# Patient Record
Sex: Male | Born: 1955 | Race: White | Hispanic: No | Marital: Married | Smoking: Former smoker
Health system: Southern US, Community
[De-identification: ages and names within clinical notes are randomized; demographics above are authoritative.]

## PROBLEM LIST (undated history)

## (undated) DIAGNOSIS — E785 Hyperlipidemia, unspecified: Secondary | ICD-10-CM

## (undated) DIAGNOSIS — J449 Chronic obstructive pulmonary disease, unspecified: Secondary | ICD-10-CM

## (undated) DIAGNOSIS — C801 Malignant (primary) neoplasm, unspecified: Secondary | ICD-10-CM

## (undated) DIAGNOSIS — M1990 Unspecified osteoarthritis, unspecified site: Secondary | ICD-10-CM

## (undated) DIAGNOSIS — C61 Malignant neoplasm of prostate: Secondary | ICD-10-CM

## (undated) DIAGNOSIS — Z8042 Family history of malignant neoplasm of prostate: Secondary | ICD-10-CM

## (undated) DIAGNOSIS — M549 Dorsalgia, unspecified: Secondary | ICD-10-CM

## (undated) HISTORY — DX: Family history of malignant neoplasm of prostate: Z80.42

## (undated) HISTORY — DX: Hyperlipidemia, unspecified: E78.5

## (undated) HISTORY — PX: PROSTATE BIOPSY: SHX241

## (undated) HISTORY — DX: Chronic obstructive pulmonary disease, unspecified: J44.9

## (undated) HISTORY — PX: COLONOSCOPY: SHX174

---

## 2008-03-16 HISTORY — PX: BACK SURGERY: SHX140

## 2009-12-23 ENCOUNTER — Encounter: Payer: Self-pay | Admitting: Internal Medicine

## 2009-12-29 ENCOUNTER — Encounter: Payer: Self-pay | Admitting: Internal Medicine

## 2010-01-16 ENCOUNTER — Ambulatory Visit: Payer: Self-pay | Admitting: Internal Medicine

## 2010-01-16 DIAGNOSIS — J449 Chronic obstructive pulmonary disease, unspecified: Secondary | ICD-10-CM | POA: Insufficient documentation

## 2010-01-16 DIAGNOSIS — R635 Abnormal weight gain: Secondary | ICD-10-CM | POA: Insufficient documentation

## 2010-01-16 DIAGNOSIS — E785 Hyperlipidemia, unspecified: Secondary | ICD-10-CM | POA: Insufficient documentation

## 2010-02-24 ENCOUNTER — Encounter: Admission: RE | Admit: 2010-02-24 | Discharge: 2010-02-24 | Payer: Self-pay | Admitting: Diagnostic Neuroimaging

## 2010-02-26 ENCOUNTER — Ambulatory Visit: Payer: Self-pay | Admitting: Internal Medicine

## 2010-05-18 ENCOUNTER — Telehealth (INDEPENDENT_AMBULATORY_CARE_PROVIDER_SITE_OTHER): Payer: Self-pay | Admitting: *Deleted

## 2010-05-28 ENCOUNTER — Telehealth (INDEPENDENT_AMBULATORY_CARE_PROVIDER_SITE_OTHER): Payer: Self-pay | Admitting: *Deleted

## 2010-06-27 ENCOUNTER — Ambulatory Visit: Payer: Self-pay | Admitting: Internal Medicine

## 2010-10-16 NOTE — Assessment & Plan Note (Signed)
Summary: Pulmonary/ f/u ov with hfa teaching   Copy to:  Dr. Valinda Party Primary Provider/Referring Provider:  none  CC:  Followup with PFT's.  Pt states that cough has resolved.  He states that he spits up clear to yellow sputum in the am.  No new complaints today.Marland Kitchen  History of Present Illness: 77 yowm on disability due back injury 2009 @ wt 210  quit smoking 2009 moved to [place] 2011 with lifelong flares of nasal congestion assoc with coughing x all his life but never saw allergist or pulmonary doctor or maintained on any inhaled agents  Jan 16, 2010 cc minimal wheeze and am mucus despite proaire 3 x daily after abx x2 and prednisone. rec review optimal use of saba and gerd diet, reconditioning and return for pfts  February 26, 2010 Followup with PFT's.  Pt states that cough has resolved.  He states that he spits up clear to yellow sputum in the am.  No new complaints today. not limited by sob. Pt denies any significant sore throat, dysphagia, itching, sneezing,  nasal congestion or excess secretions,  fever, chills, sweats, unintended wt loss, pleuritic or exertional cp, hempoptysis, change in activity tolerance  orthopnea pnd or leg swelling.    Current Medications (verified): 1)  Nexium 40 Mg Cpdr (Esomeprazole Magnesium) .... Take One 30-60 Min Before First and Last Meals of The Day 2)  Flexeril 10 Mg Tabs (Cyclobenzaprine Hcl) .Marland Kitchen.. 1 Every 8 Hours As Needed 3)  Cialis 20 Mg Tabs (Tadalafil) .... As Directed As Needed 4)  Oxycodone Hcl 10 Mg Tabs (Oxycodone Hcl) .... 1/2 As Directed As Needed 5)  Neurontin 600 Mg Tabs (Gabapentin) .Marland Kitchen.. 1 Two Times A Day As Needed 6)  Proair Hfa 108 (90 Base) Mcg/act Aers (Albuterol Sulfate) .... 2 Puffs Every 4-6 Hours As Needed  Allergies (verified): 1)  ! Pcn 2)  ! Levaquin 3)  ! * Sporonox  Past History:  Past Medical History: Hyperlipidemia COPD     - PFT's February 26, 2010   FEV1 1.97  (51%)  ratio 49 with 41% improvement after B2, nl 80     -  HFA 75% February 26, 2010   Vital Signs:  Patient profile:   55 year old male Weight:      243 pounds O2 Sat:      98 % on Room air Temp:     98.2 degrees F oral Pulse rate:   64 / minute BP sitting:   130 / 78  (left arm)  Vitals Entered By: Vernie Murders (February 26, 2010 12:07 PM)  O2 Flow:  Room air  Physical Exam  Additional Exam:  amb wm nad wt 250 Jan 16, 2010 > 243 February 26, 2010  HEENT: nl dentition, turbinates, and orophanx. Nl external ear canals without cough reflex NECK :  without JVD/Nodes/TM/ nl carotid upstrokes bilaterally LUNGS: no acc muscle use, clear to A and P bilaterally without cough on insp or exp maneuvers CV:  RRR  no s3 or murmur or increase in P2, no edema  ABD:  soft and nontender with nl excursion in the supine position. No bruits or organomegaly, bowel sounds nl MS:  warm without deformities, calf tenderness, cyanosis or clubbing      Impression & Recommendations:  Problem # 1:  COPD UNSPECIFIED (ICD-496) More severe (GOLD II) than previously appreciated probably because more limited by back and legs than breathing; however, has very significant reversible (AB) component and would therefore  benefit from Symbicort trial  I spent extra time with the patient today explaining optimal mdi  technique.  This improved from  50-75%   Each maintenance medication was reviewed in detail including most importantly the difference between maintenance and as needed and under what circumstances the prns are to be used. See instructions for specific recommendations   I discussed the importance of understanding the goals of asthma therapy including the rule of 2's as it applies to the use of a rescue inhaler.   Medications Added to Medication List This Visit: 1)  Symbicort 160-4.5 Mcg/act Aero (Budesonide-formoterol fumarate) .... 2 puffs first thing  in am and 2 puffs again in pm about 12 hours later  Other Orders: Est. Patient Level IV (16109)  Patient  Instructions: 1)  Symbiocort 160 2 puffs first thing  in am and 2 puffs again in pm about 12 hours later  2)  Work on inhaler technique:  relax and blow all the way out then take a nice smooth deep breath back in, triggering the inhaler at same time you start breathing in 3)  Return to office in 3 months, sooner if needed  Prescriptions: SYMBICORT 160-4.5 MCG/ACT  AERO (BUDESONIDE-FORMOTEROL FUMARATE) 2 puffs first thing  in am and 2 puffs again in pm about 12 hours later  #1 x 11   Entered and Authorized by:   Nyoka Cowden MD   Signed by:   Nyoka Cowden MD on 02/26/2010   Method used:   Print then Give to Patient   RxID:   [id number]

## 2010-10-16 NOTE — Assessment & Plan Note (Signed)
Summary: Pulmonary/ ext summary f/u ov with hfa 90%   Copy to:  Dr. Valinda Party Primary Provider/Referring Provider:  none  CC:  COPD followup.  Breathing is the same.  He c/o throat clearing in the am..  History of Present Illness: 46  yowm on disability due back injury 2009 @ wt 210  quit smoking 2009 moved to [place] 2011 with lifelong flares of nasal congestion assoc with coughing x all his life but never saw allergist or pulmonary doctor or maintained on any inhaled agents  Jan 16, 2010 cc minimal wheeze and am mucus despite proaire 3 x daily after abx x2 and prednisone. rec review optimal use of saba and gerd diet, reconditioning and return for pfts  Jun 27 2010 ov cc doe.    Pt states that cough has resolved.  He states that he spits up clear to yellow sputum in the am only then fine the rest of the day. .  No new complaints today. not limited by sob but by back at this point.   Pt denies any significant sore throat, dysphagia, itching, sneezing,  nasal congestion or excess secretions,  fever, chills, sweats, unintended wt loss, pleuritic or exertional cp, hempoptysis, change in activity tolerance  orthopnea pnd or leg swelling. Pt also denies any obvious fluctuation in symptoms with weather or environmental change or other alleviating or aggravating factors.     Pt denies any increase in rescue therapy over baseline, denies waking up needing it or having early am exacerbations of coughing/wheezing/ or dyspnea     Current Medications (verified): 1)  Nexium 40 Mg Cpdr (Esomeprazole Magnesium) .... Take One 30-60 Min Before First and Last Meals of The Day 2)  Flexeril 10 Mg Tabs (Cyclobenzaprine Hcl) .Marland Kitchen.. 1 Every 8 Hours As Needed 3)  Cialis 20 Mg Tabs (Tadalafil) .... As Directed As Needed 4)  Symbicort 160-4.5 Mcg/act  Aero (Budesonide-Formoterol Fumarate) .... 2 Puffs First Thing  in Am and 2 Puffs Again in Pm About 12 Hours Later- 2 Puffs Once Per Day 5)  Proair Hfa 108 (90 Base)  Mcg/act Aers (Albuterol Sulfate) .... 2 Puffs Every 4-6 Hours As Needed 6)  Lyrica ( ? Strength) .Marland Kitchen.. 1 Two Times A Day 7)  Flexeril 10 Mg Tabs (Cyclobenzaprine Hcl) .Marland Kitchen.. 1 Two Times A Day 8)  Tramadol Hcl 50 Mg Tabs (Tramadol Hcl) .Marland Kitchen.. 1 Two Times A Day 9)  Cod Liver Oil  Caps (Cod Liver Oil) .Marland Kitchen.. 1 Once Daily 10)  Multivitamins  Tabs (Multiple Vitamin) .Marland Kitchen.. 1 Once Daily 11)  Prostate  Tabs (Specialty Vitamins Products) .Marland Kitchen.. 1 Once Daily 12)  Glucosamine-Chondroitin  Caps (Glucosamine-Chondroit-Vit C-Mn) .Marland Kitchen.. 1 Once Daily  Allergies (verified): 1)  ! Pcn 2)  ! Levaquin 3)  ! * Sporonox  Past History:  Past Medical History: Hyperlipidemia COPD     - PFT's February 26, 2010   FEV1 1.97  (51%)  ratio 49 with 41% improvement after B2, nl 80     - HFA 75% February 26, 2010  >  90% June 27, 2010   Vital Signs:  Patient profile:   55 year old male Weight:      244 pounds O2 Sat:      95 % on Room air Temp:     98.1 degrees F oral Pulse rate:   80 / minute BP sitting:   120 / 80  (left arm)  Vitals Entered By: Vernie Murders (June 27, 2010 11:23 AM)  O2  Flow:  Room air  Physical Exam  Additional Exam:  amb wm nad wt 250 Jan 16, 2010 > 243 February 26, 2010 > 244 June 27, 2010  HEENT: nl dentition, turbinates, and orophanx. Nl external ear canals without cough reflex NECK :  without JVD/Nodes/TM/ nl carotid upstrokes bilaterally LUNGS: no acc muscle use, clear to A and P bilaterally without cough on insp or exp maneuvers CV:  RRR  no s3 or murmur or increase in P2, no edema  ABD:  soft and nontender with nl excursion in the supine position. No bruits or organomegaly, bowel sounds nl MS:  warm without deformities, calf tenderness, cyanosis or clubbing      Impression & Recommendations:  Problem # 1:  COPD UNSPECIFIED (ICD-496) GOLD II with definite  benefit from Symbicort trial  I spent extra time with the patient today explaining optimal mdi  technique.  This improved from  75 -90% p coaching   Each maintenance medication was reviewed in detail including most importantly the difference between maintenance and as needed and under what circumstances the prns are to be used. See instructions for specific recommendations   I discussed the importance of understanding the goals of asthma therapy including the rule of 2's as it applies to the use of a rescue inhaler.     DDX of  difficult airways managment all start with A and  include Adherence, Ace Inhibitors, Acid Reflux, Active Sinus Disease, Alpha 1 Antitripsin deficiency, Anxiety masquerading as Airways dz,  ABPA,  allergy(esp in young), Aspiration (esp in elderly), Adverse effects of DPI,  Active smokers, plus one B  = Beta blocker use..   Acid reflux still a concern.  NB the  ramp to expected improvement (and for that matter, worsening, if a chronic effective medication is stopped)  can be measured in weeks, not days, a common misconception because this is not Heartburn with no immediate cause and effect relationship so that response to therapy or lack thereof can be very difficult to assess.  rec continue nexium,  Stop all oil based supplements.  Medications Added to Medication List This Visit: 1)  Symbicort 160-4.5 Mcg/act Aero (Budesonide-formoterol fumarate) .... 2 puffs first thing  in am and 2 puffs again in pm about 12 hours optional if any breathing or coughing 2)  Symbicort 160-4.5 Mcg/act Aero (Budesonide-formoterol fumarate) .... 2 puffs first thing  in am and 2 puffs again in pm about 12 hours later- 2 puffs once per day 3)  Lyrica ( ? Strength)  .Marland Kitchen.. 1 two times a day 4)  Flexeril 10 Mg Tabs (Cyclobenzaprine hcl) .Marland Kitchen.. 1 two times a day 5)  Tramadol Hcl 50 Mg Tabs (Tramadol hcl) .Marland Kitchen.. 1 two times a day 6)  Cod Liver Oil Caps (Cod liver oil) .Marland Kitchen.. 1 once daily 7)  Multivitamins Tabs (Multiple vitamin) .Marland Kitchen.. 1 once daily 8)  Prostate Tabs (Specialty vitamins products) .Marland Kitchen.. 1 once daily 9)   Glucosamine-chondroitin Caps (Glucosamine-chondroit-vit c-mn) .Marland Kitchen.. 1 once daily  Other Orders: Est. Patient Level IV (14782) HFA Instruction [phone])  Patient Instructions: 1)  ok to take symbicort 160 2 puffs first thing  in am and 2 puffs again in pm about 12 hours if needed 2)  Work on inhaler technique:  relax and blow all the way out then take a nice smooth deep breath back in, triggering the inhaler at same time you start breathing in and hold a few seconds then out thru the nose and brush teetch  tongue and gargle  3)  GERD (REFLUX)  is a common cause of respiratory symptoms. It commonly presents without heartburn and can be treated with medication, but also with lifestyle changes including avoidance of late meals, excessive alcohol, smoking cessation, and avoid fatty foods, chocolate, peppermint, colas, red wine, and acidic juices such as orange juice. NO MINT OR MENTHOL PRODUCTS SO NO COUGH DROPS  4)  USE SUGARLESS CANDY INSTEAD (jolley ranchers)  5)  NO OIL BASED VITAMINS  6)  Please schedule a follow-up appointment as needed.

## 2010-10-16 NOTE — Progress Notes (Signed)
Summary: diagnosis  Phone Note Call from Patient Call back at Home Phone [phone]   Caller: Patient Call For: wert Summary of Call: pt wants to speak to nurse. just wants her to expalin his diagnosis to him.  Initial call taken by: Tivis Ringer, CNA,  May 18, 2010 9:55 AM  Follow-up for Phone Call        Spoke with pt and explained that per last ov note his dx is COPD.  He is requesting some literature on this so I mailed him a handout and sched him rov for 05/29/10.   Follow-up by: Vernie Murders,  May 18, 2010 10:19 AM

## 2010-10-16 NOTE — Assessment & Plan Note (Signed)
Summary: Pulmonary/ copd eval with hfa teaching   Visit Type:  Initial Consult Copy to:  Dr. Valinda Party Primary Provider/Referring Provider:  none  CC:  Cough.  History of Present Illness: 23 yowm on disability due back injury 2009 @ wt 210  quit smoking 2009 moved to [place] 2011 with lifelong flares of nasal congestion assoc with coughing x all his life but never saw allergist or pulmonary doctor or maintained on any inhaled  Jan 16, 2010 cc minimal wheeze and am mucus despite proaire 3 x daily after abx x2 and prednisone. Pt denies any significant sore throat, dysphagia, itching, sneezing,  nasal congestion or excess secretions,  fever, chills, sweats, unintended wt loss, pleuritic or exertional cp, hempoptysis, change in activity tolerance  orthopnea pnd or leg swelling Pt also denies any obvious fluctuation in symptoms with weather or environmental change or other alleviating or aggravating factors.         Current Medications (verified): 1)  Nexium 40 Mg Cpdr (Esomeprazole Magnesium) .Marland Kitchen.. 1 Two Times A Day 2)  Flexeril 10 Mg Tabs (Cyclobenzaprine Hcl) .Marland Kitchen.. 1 Every 8 Hours As Needed 3)  Cialis 20 Mg Tabs (Tadalafil) .... As Directed As Needed 4)  Oxycodone Hcl 10 Mg Tabs (Oxycodone Hcl) .... 1/2 As Directed As Needed 5)  Neurontin 600 Mg Tabs (Gabapentin) .Marland Kitchen.. 1 Two Times A Day As Needed 6)  Proair Hfa 108 (90 Base) Mcg/act Aers (Albuterol Sulfate) .... 2 Puffs Every 4-6 Hours As Needed  Allergies (verified): 1)  ! Pcn 2)  ! Levaquin 3)  ! * Sporonox  Past History:  Past Medical History: Hyperlipidemia COPD  Past Surgical History: L-4, L-5 surgery July 2009  Family History: Mother had unknown type of CA Heart dz- Father Allergies- Mother  Social History: Divorced Lives with girlfriend Umemployed Former smoker. Quit in 2009.  Smoked 1 ppd x 30 yrs. Moved to [place] from [place] in 2011 ETOH twice per wk  Review of Systems       The patient complains of shortness of  breath with activity, productive cough, acid heartburn, weight change, headaches, nasal congestion/difficulty breathing through nose, and anxiety.  The patient denies shortness of breath at rest, non-productive cough, coughing up blood, chest pain, irregular heartbeats, indigestion, loss of appetite, abdominal pain, difficulty swallowing, sore throat, tooth/dental problems, sneezing, itching, ear ache, depression, hand/feet swelling, joint stiffness or pain, rash, change in color of mucus, and fever.    Vital Signs:  Patient profile:   55 year old male Height:      74 inches Weight:      250.13 pounds BMI:     32.23 O2 Sat:      96 % on Room air Temp:     97.9 degrees F oral Pulse rate:   79 / minute BP sitting:   140 / 80  (left arm)  Vitals Entered By: Vernie Murders (Jan 16, 2010 11:06 AM)  O2 Flow:  Room air  Physical Exam  Additional Exam:  amb wm nad wt 250 Jan 16, 2010 HEENT: nl dentition, turbinates, and orophanx. Nl external ear canals without cough reflex NECK :  without JVD/Nodes/TM/ nl carotid upstrokes bilaterally LUNGS: no acc muscle use, clear to A and P bilaterally without cough on insp or exp maneuvers CV:  RRR  no s3 or murmur or increase in P2, no edema  ABD:  soft and nontender with nl excursion in the supine position. No bruits or organomegaly, bowel sounds nl  MS:  warm without deformities, calf tenderness, cyanosis or clubbing SKIN: warm and dry without lesions   NEURO:  alert, approp, no deficits     Impression & Recommendations:  Problem # 1:  COPD UNSPECIFIED (ICD-496) When respiratory symptoms begin well after a patient reports complete smoking cessation,  it is very hard to "blame" COPD  ie it doesn't make any more sense than hearing a  NASCAR driver wrecked his car while driving his kids to school or a Careers adviser sliced his hand off carving [place].  Once the high risk activity stops,  the symptoms should not suddenly erupt.  If so, the differential diagnosis  should include  obesity/deconditioning,  LPR/Reflux, CHF, or side effect of medications   Suspect obesity and reflux driving most of his symptoms because he doesn't have prominent features of copd at this point  I spent extra time with the patient today explaining optimal mdi  technique.  This improved from  50-75%  I discussed the importance of understanding the goals of asthma therapy including the rule of 2's as it applies to the use of a rescue inhaler.   Problem # 2:  WEIGHT GAIN, ABNORMAL (ICD-783.1)  Weight control is a matter of calorie balance which needs to be tilted in the pt's favor by eating less and exercising more.  Specifically, I recommended  exercise at a level where pt  is short of breath but not out of breath 30 minutes daily.  If not losing weight on this program, I would strongly recommend pt see a nutritionist with a food diary recorded for two weeks prior to the visit.     Orders: Consultation Level IV (81191)  Medications Added to Medication List This Visit: 1)  Nexium 40 Mg Cpdr (Esomeprazole magnesium) .Marland Kitchen.. 1 two times a day 2)  Nexium 40 Mg Cpdr (Esomeprazole magnesium) .... Take one 30-60 min before first and last meals of the day 3)  Flexeril 10 Mg Tabs (Cyclobenzaprine hcl) .Marland Kitchen.. 1 every 8 hours as needed 4)  Cialis 20 Mg Tabs (Tadalafil) .... As directed as needed 5)  Oxycodone Hcl 10 Mg Tabs (Oxycodone hcl) .... 1/2 as directed as needed 6)  Neurontin 600 Mg Tabs (Gabapentin) .Marland Kitchen.. 1 two times a day as needed 7)  Proair Hfa 108 (90 Base) Mcg/act Aers (Albuterol sulfate) .... 2 puffs every 4-6 hours as needed  Patient Instructions: 1)  Please schedule a follow-up appointment in 6 weeks, sooner if needed with PFT's on return 2)  Weight control is simply a matter of calorie balance which needs to be tilted in your favor by eating less and exercising more.  To get the most out of exercise, you need to be continuously aware that you are short of breath, but never  out of breath, for 30 minutes daily. As you improve, it will actually be easier for you to do the same amount in  30 minutes so always push to the level where you are short of breath.  If this does not result in gradual weight reduction,  I recommend  a nutritionist for a food diary 3)  GERD (REFLUX)  is a common cause of respiratory symptoms. It commonly presents without heartburn and can be treated with medication, but also with lifestyle changes including avoidance of late meals, excessive alcohol, smoking cessation, and avoid fatty foods, chocolate, peppermint, colas, red wine, and acidic juices such as orange juice. NO MINT OR MENTHOL PRODUCTS SO NO COUGH DROPS  4)  USE SUGARLESS  CANDY INSTEAD (jolley ranchers)  5)  NO OIL BASED VITAMINS  6)  Use proaire only if needed up to every 4 hours with the goal of not needing it at all - if you start to need more proaire call me  Prescriptions: PROAIR HFA 108 (90 BASE) MCG/ACT AERS (ALBUTEROL SULFATE) 2 puffs every 4-6 hours as needed  #1 x 1   Entered and Authorized by:   Nyoka Cowden MD   Signed by:   Nyoka Cowden MD on 01/16/2010   Method used:   Electronically to        [facility] [address] [phone]* (retail)       [address]       [place], [place]  [address]       Ph: [phone] or [phone]       Fax: [phone]   RxID:   [id number]

## 2010-10-16 NOTE — Progress Notes (Signed)
Summary: sample  Phone Note Call from Patient Call back at Home Phone [phone]   Caller: Patient Call For: wert Reason for Call: Talk to Nurse Summary of Call: pt would like to know if he can get a Symbicort sample - Can pick up today or tomorrow. Initial call taken by: Eugene Gavia,  May 28, 2010 10:40 AM  Follow-up for Phone Call        Spoke with pt and notified sample left up front for pick up.  Rx was sent to pharm. Follow-up by: Vernie Murders,  May 28, 2010 11:08 AM    Prescriptions: SYMBICORT 160-4.5 MCG/ACT  AERO (BUDESONIDE-FORMOTEROL FUMARATE) 2 puffs first thing  in am and 2 puffs again in pm about 12 hours later  #1 x 11   Entered by:   Vernie Murders   Authorized by:   Nyoka Cowden MD   Signed by:   Vernie Murders on 05/28/2010   Method used:   Electronically to        [facility] [address] [phone]* (retail)       [address]       [place], [place]  [address]       Ph: [phone] or [phone]       Fax: [phone]   RxID:   [id number]

## 2010-10-16 NOTE — Miscellaneous (Signed)
Summary: Orders Update pft charges  Clinical Lists Changes  Orders: Added new Service order of Carbon Monoxide diffusing w/capacity (94720) - Signed Added new Service order of Lung Volumes (94240) - Signed Added new Service order of Spirometry (Pre & Post) (94060) - Signed 

## 2011-05-27 ENCOUNTER — Emergency Department (HOSPITAL_COMMUNITY)
Admission: EM | Admit: 2011-05-27 | Discharge: 2011-05-28 | Disposition: A | Discharge status: Home / Self Care | Payer: Medicare Other | Attending: Emergency Medicine | Admitting: Emergency Medicine

## 2011-05-27 DIAGNOSIS — I4510 Unspecified right bundle-branch block: Secondary | ICD-10-CM | POA: Insufficient documentation

## 2011-05-27 DIAGNOSIS — M79609 Pain in unspecified limb: Secondary | ICD-10-CM | POA: Insufficient documentation

## 2011-05-28 LAB — COMPREHENSIVE METABOLIC PANEL
ALT: 29 U/L (ref 0–53)
AST: 25 U/L (ref 0–37)
Albumin: 4.3 g/dL (ref 3.5–5.2)
Alkaline Phosphatase: 63 U/L (ref 39–117)
BUN: 17 mg/dL (ref 6–23)
CO2: 28 mEq/L (ref 19–32)
Calcium: 9.8 mg/dL (ref 8.4–10.5)
Chloride: 102 mEq/L (ref 96–112)
Creatinine, Ser: 0.96 mg/dL (ref 0.50–1.35)
GFR calc Af Amer: >60 mL/min (ref >60)
GFR calc non Af Amer: >60 mL/min (ref >60)
Glucose, Bld: 90 mg/dL (ref 70–99)
Potassium: 4.1 mEq/L (ref 3.5–5.1)
Sodium: 137 mEq/L (ref 135–145)
Total Bilirubin: 0.2 mg/dL — ABNORMAL LOW (ref 0.3–1.2)
Total Protein: 7.5 g/dL (ref 6.0–8.3)

## 2011-05-28 LAB — DIFFERENTIAL
Basophils Absolute: 0 10*3/uL (ref 0.0–0.1)
Basophils Relative: 1 % (ref 0–1)
Eosinophils Absolute: 0.2 10*3/uL (ref 0.0–0.7)
Eosinophils Relative: 3 % (ref 0–5)
Lymphocytes Relative: 47 % — ABNORMAL HIGH (ref 12–46)
Lymphs Abs: 3.6 10*3/uL (ref 0.7–4.0)
Monocytes Absolute: 0.7 10*3/uL (ref 0.1–1.0)
Monocytes Relative: 10 % (ref 3–12)
Neutro Abs: 3 10*3/uL (ref 1.7–7.7)
Neutrophils Relative %: 39 % — ABNORMAL LOW (ref 43–77)

## 2011-05-28 LAB — POCT I-STAT TROPONIN I: Troponin i, poc: 0 ng/mL (ref 0.00–0.08)

## 2011-05-28 LAB — CBC
HCT: 42.7 % (ref 39.0–52.0)
Hemoglobin: 14.8 g/dL (ref 13.0–17.0)
MCH: 30.3 pg (ref 26.0–34.0)
MCHC: 34.7 g/dL (ref 30.0–36.0)
MCV: 87.3 fL (ref 78.0–100.0)
Platelets: 358 10*3/uL (ref 150–400)
RBC: 4.89 MIL/uL (ref 4.22–5.81)
RDW: 12.2 % (ref 11.5–15.5)
WBC: 7.5 10*3/uL (ref 4.0–10.5)

## 2011-05-28 LAB — CK TOTAL AND CKMB (NOT AT ARMC)
CK, MB: 3.6 ng/mL (ref 0.3–4.0)
Relative Index: 3.2 — ABNORMAL HIGH (ref 0.0–2.5)
Total CK: 114 U/L (ref 7–232)

## 2011-07-16 ENCOUNTER — Other Ambulatory Visit: Payer: Self-pay | Admitting: Internal Medicine

## 2011-08-13 ENCOUNTER — Other Ambulatory Visit: Payer: Self-pay | Admitting: Internal Medicine

## 2011-10-07 ENCOUNTER — Encounter: Payer: Self-pay | Admitting: Internal Medicine

## 2011-10-08 ENCOUNTER — Ambulatory Visit (INDEPENDENT_AMBULATORY_CARE_PROVIDER_SITE_OTHER)
Admission: RE | Admit: 2011-10-08 | Discharge: 2011-10-08 | Disposition: A | Discharge status: Home / Self Care | Payer: Medicare Other | Source: Ambulatory Visit | Attending: Internal Medicine | Admitting: Internal Medicine

## 2011-10-08 ENCOUNTER — Encounter: Payer: Self-pay | Admitting: Internal Medicine

## 2011-10-08 ENCOUNTER — Ambulatory Visit (INDEPENDENT_AMBULATORY_CARE_PROVIDER_SITE_OTHER): Payer: Medicare Other | Admitting: Internal Medicine

## 2011-10-08 VITALS — BP 118/84 | HR 95 | Temp 98.0°F | Ht 74.0 in | Wt 236.0 lb

## 2011-10-08 DIAGNOSIS — J4489 Other specified chronic obstructive pulmonary disease: Secondary | ICD-10-CM

## 2011-10-08 DIAGNOSIS — J449 Chronic obstructive pulmonary disease, unspecified: Secondary | ICD-10-CM

## 2011-10-08 NOTE — Progress Notes (Signed)
Subjective:     Patient ID: Adam Hess, male   DOB: Apr 07, 1956  MRN: [id number]  HPI  Brief patient profile:  47 yowm on disability due back injury 2009 @ wt 210 quit smoking 2009 moved to [place] 2011 with lifelong flares of nasal congestion assoc with coughing x all his life  And GOLD II/III COPD 02/2010  Jan 16, 2010 cc minimal wheeze and am mucus despite proaire 3 x daily after abx x2 and prednisone. rec review optimal use of saba and gerd diet, reconditioning and return for pfts   Jun 27 2010 ov cc doe.cough has resolved. He states that he spits up clear to yellow sputum in the am only then fine the rest of the day. .   Patient Instructions:  1) ok to take symbicort 160 2 puffs first thing in am and 2 puffs again in pm about 12 hours if needed  2) Work on inhaler technique: relax and blow all the way out then take a nice smooth deep breath back in, triggering the inhaler at same time you start breathing in and hold a few seconds then out thru the nose and brush teetch tongue and gargle  3) GERD diet reviewed    10/08/2011 f/u ov/Adam Hess cc doe only x steps,  Am sputum for one episode daily and done,  Using symbicort in am only due to cost.  Main issue is steps, does ok on flat surfaces but limited by back.  Sleeping ok without nocturnal  or early am exacerbation  of respiratory  c/o's or need for noct saba. Also denies any obvious fluctuation of symptoms with weather or environmental changes or other aggravating or alleviating factors except as outlined above   ROS  At present neg for  any significant sore throat, dysphagia, itching, sneezing,  nasal congestion or excess/ purulent secretions,  fever, chills, sweats, unintended wt loss, pleuritic or exertional cp, hempoptysis, orthopnea pnd or leg swelling.  Also denies presyncope, palpitations, heartburn, abdominal pain, nausea, vomiting, diarrhea  or change in bowel or urinary habits, dysuria,hematuria,  rash, arthralgias, visual complaints,  headache, numbness weakness or ataxia.              Allergies  1) ! Pcn  2) ! Levaquin  3) ! * Sporonox   Past Medical History:  Hyperlipidemia  COPD  - PFT's February 26, 2010 FEV1 1.97 (51%) ratio 49 with 41% improvement after B2, nl 80  - HFA 75% February 26, 2010 > 90% June 27, 2010     Review of Systems     Objective:   Physical Exam    wt 250 Jan 16, 2010 > 243 February 26, 2010 > 244 June 27, 2010 > 236 10/08/11 HEENT: nl dentition, turbinates, and orophanx. Nl external ear canals without cough reflex  NECK : without JVD/Nodes/TM/ nl carotid upstrokes bilaterally  LUNGS: no acc muscle use, clear to A and P bilaterally without cough on insp or exp maneuvers  CV: RRR no s3 or murmur or increase in P2, no edema  ABD: soft and nontender with nl excursion in the supine position. No bruits or organomegaly, bowel sounds nl  MS: warm without deformities, calf tenderness, cyanosis or clubbing   cxr 10/08/11 No acute cardiopulmonary disease.  Assessment:         Plan:

## 2011-10-08 NOTE — Patient Instructions (Signed)
Work on inhaler technique:  relax and gently blow all the way out then take a nice smooth deep breath back in, triggering the inhaler at same time you start breathing in.  Hold for up to 5 seconds if you can.  Rinse and gargle with water when done   If your mouth or throat starts to bother you,   I suggest you time the inhaler to your dental care and after using the inhaler(s) brush teeth and tongue with a baking soda containing toothpaste and when you rinse this out, gargle with it first to see if this helps your mouth and throat.    Please remember to go to the x-ray department downstairs for your tests - we will call you with the results when they are available.   If you are satisfied with your treatment plan let your doctor know and he/she can either refill your medications or you can return here when your prescription runs out.     If in any way you are not 100% satisfied,  please tell us.  If 100% better, tell your friends!   Pulmonary follow up is as needed

## 2011-10-09 NOTE — Progress Notes (Signed)
Quick Note:  Spoke with pt and notified of results per Dr. Wert. Pt verbalized understanding and denied any questions.  ______ 

## 2011-10-18 NOTE — Assessment & Plan Note (Signed)
-   PFT's February 26, 2010 FEV1 1.97 (51%) ratio 49 with 41% improvement after B2, nl 80  - HFA 75% 10/08/2011   GOLD II with very dramatic response to B2 so best bet is to maximize/ optimize use of symbicort, not add additional meds/ expenses  The proper method of use, as well as anticipated side effects, of this metered-dose inhaler are discussed and demonstrated to the patient. impoved to 75% with coaching    Each maintenance medication was reviewed in detail including most importantly the difference between maintenance and as needed and under what circumstances the prns are to be used.  Please see instructions for details which were reviewed in writing and the patient given a copy.

## 2014-08-25 ENCOUNTER — Telehealth: Payer: Self-pay | Admitting: *Deleted

## 2014-08-25 NOTE — Telephone Encounter (Signed)
Patient CDS mailed to secure records service on 08/25/14. (Fine, Adam Hess)

## 2015-10-13 ENCOUNTER — Ambulatory Visit (INDEPENDENT_AMBULATORY_CARE_PROVIDER_SITE_OTHER): Payer: Medicare HMO | Admitting: Internal Medicine

## 2015-10-13 ENCOUNTER — Encounter: Payer: Self-pay | Admitting: Internal Medicine

## 2015-10-13 VITALS — BP 126/78 | HR 66 | Ht 76.0 in | Wt 234.0 lb

## 2015-10-13 DIAGNOSIS — J449 Chronic obstructive pulmonary disease, unspecified: Secondary | ICD-10-CM | POA: Diagnosis not present

## 2015-10-13 DIAGNOSIS — R058 Other specified cough: Secondary | ICD-10-CM | POA: Insufficient documentation

## 2015-10-13 DIAGNOSIS — R05 Cough: Secondary | ICD-10-CM | POA: Diagnosis not present

## 2015-10-13 NOTE — Patient Instructions (Signed)
Whenever coughing > nexium 40 Take 30-60 min before first meal of the day and pepcid ac 20 mg at bedtime   For cough > mucinex dm 1200 mg every 12 hours  Please see patient coordinator before you leave today  to schedule sinus CT   GERD (REFLUX)  is an extremely common cause of respiratory symptoms just like yours , many times with no obvious heartburn at all.    It can be treated with medication, but also with lifestyle changes including elevation of the head of your bed (ideally with 6 inch  bed blocks),  Smoking cessation, avoidance of late meals, excessive alcohol, and avoid fatty foods, chocolate, peppermint, colas, red wine, and acidic juices such as orange juice.  NO MINT OR MENTHOL PRODUCTS SO NO COUGH DROPS  USE SUGARLESS CANDY INSTEAD (Jolley ranchers or Stover's or Life Savers) or even ice chips will also do - the key is to swallow to prevent all throat clearing. NO OIL BASED VITAMINS - use powdered substitutes.

## 2015-10-13 NOTE — Progress Notes (Signed)
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 rec review optimal use of saba and gerd diet, reconditioning and return for pfts     Jun 27 2010 ov cc doe - cough has resolved. He states that he spits up clear to yellow sputum in the am only then fine the rest of the day. .   Patient Instructions:  1) ok to take symbicort 160 2 puffs first thing in am and 2 puffs again in pm about 12 hours if needed  2) Work on inhaler technique: relax and blow all the way out then take a nice smooth deep breath back in, triggering the inhaler at same time you start breathing in and hold a few seconds then out thru the nose and brush teetch tongue and gargle  3) GERD diet reviewed    10/08/2011 f/u ov/Makenli Derstine cc doe only x steps,  Am sputum for one episode daily and done,  Using symbicort in am only due to cost.  Main issue is steps, does ok on flat surfaces but limited by back. rec Work on inhaler technique:    10/13/2015 new pt eval (> 3 y since last visit) ov/Caedyn Raygoza re:   COPD GOLD II  / no maint rx Chief Complaint  Patient presents with  . Follow-up    Pt states had episode of "choking" in the night 09/10/16- since then he as had cough on an off.  baseline = able to do eliptical x 30 m/ treadmill ok no inhalers at all  Acutely Dec 27th p inhaling flavored mist = severe cough / hurt to cough diffusely bilateral lower / zpak/ steroids/ cough med / inhaler twice daily ventolin and some better with more day than noct coughing still but not productive  No obvious day to day or daytime variability or assoc sob or cp or chest  tightness, subjective wheeze or overt sinus or hb symptoms. No unusual exp hx or h/o childhood pna/ asthma or knowledge of premature birth.  Sleeping ok without nocturnal  or early am exacerbation  of respiratory  c/o's or need for noct saba. Also denies any obvious fluctuation of symptoms with weather or environmental changes or other aggravating or alleviating factors except as outlined above   Current Medications, Allergies, Complete Past Medical History, Past Surgical History, Family History, and Social History were reviewed in [facility] record.  ROS  The following are not active complaints unless bolded sore throat, dysphagia, dental problems, itching, sneezing,  nasal congestion or excess/ purulent secretions, ear ache,   fever, chills, sweats, unintended wt loss, classically pleuritic or exertional cp, hemoptysis,  orthopnea pnd or leg swelling, presyncope, palpitations, abdominal pain, anorexia, nausea, vomiting, diarrhea  or change in bowel or bladder habits, change in stools or urine, dysuria,hematuria,  rash, arthralgias, visual complaints, headache, numbness, weakness or ataxia or problems with walking or coordination,  change in mood/affect or memory.          Past Medical History:  Hyperlipidemia  COPD  - PFT's February 26, 2010 FEV1 1.97 (51%) ratio 49 with 41% improvement after B2, nl 80  - HFA 75% February 26, 2010 > 90% June 27, 2010          Objective:   Physical Exam  amb wm very nasal tone to voice otherwise nad/ vital signs reviewed  Unusual affect with responses like " how are you breathing now? "  A :" well what does my  paperwork say about that" (30 pages reviewed from recent cough flare)     wt 250 Jan 16, 2010 > 243 February 26, 2010 > 244 June 27, 2010 > 236 10/08/11 > 10/13/2015  234  HEENT: nl dentition, turbinates, and orophanx. Nl external ear canals without cough reflex  NECK : without JVD/Nodes/TM/ nl carotid upstrokes bilaterally   LUNGS: no acc muscle use, clear to A and P bilaterally without cough on insp or exp maneuvers  CV: RRR no s3 or murmur or increase in P2, no edema  ABD: soft and nontender with nl excursion in the supine position. No bruits or organomegaly, bowel sounds nl  MS: warm without deformities, calf tenderness, cyanosis or clubbing    I personally reviewed images and agree with radiology impression as follows:  CXR:  Sep 13 2015  No active dz      Assessment:

## 2015-10-14 ENCOUNTER — Encounter: Payer: Self-pay | Admitting: Internal Medicine

## 2015-10-14 NOTE — Assessment & Plan Note (Signed)
-   quit smoking 2009 - PFT's February 26, 2010 FEV1 1.97 (51%) ratio 49 with 41% improvement after B2, nl 80  - HFA 75% 10/08/2011    Well enough compensated chronically to where needed no rx   I reviewed the Fletcher curve with the patient that basically indicates  if you quit smoking when your best day FEV1 is still well preserved (as is relatively  the case here)  it is highly unlikely you will progress to severe disease and informed the patient there was no medication on the market that has proven to alter the curve/ its downward trajectory  or the likelihood of progression of their disease.  Therefore stopping smoking and maintaining abstinence is the most important aspect of care, not choice of inhalers or for that matter, doctors.    As I explained to this patient in detail:  although there may be copd present, it may not be clinically relevant:   it does not appear to be limiting activity tolerance  That is to say:  I don't recommend aggressive pulmonary rx at this point unless limiting symptoms arise or recurrent acute exacerbations become as issue, neither of which is the case now.  I asked the patient to contact this office at any time in the future should either of these problems arise.

## 2015-10-14 NOTE — Assessment & Plan Note (Signed)
Cough is not typcal of AB or copd exac at this point but more likely due to  Classic Upper airway cough syndrome, so named because it's frequently impossible to sort out how much is  CR/sinusitis with freq throat clearing (which can be related to primary GERD)   vs  causing  secondary (" extra esophageal")  GERD from wide swings in gastric pressure that occur with throat clearing, often  promoting self use of mint and menthol lozenges that reduce the lower esophageal sphincter tone and exacerbate the problem further in a cyclical fashion.   These are the same pts (now being labeled as having "irritable larynx syndrome" by some cough centers) who not infrequently have a history of having failed to tolerate ace inhibitors,  dry powder inhalers or biphosphonates or report having atypical reflux symptoms that don't respond to standard doses of PPI , and are easily confused as having aecopd or asthma flares by even experienced allergists/ pulmonologists.  Explained the natural history of uri and why it's necessary in patients at risk to treat GERD aggressively - at least  short term -   to reduce risk of evolving cyclical cough initially  triggered by epithelial injury and a heightened sensitivty to the effects of any upper airway irritants,  most importantly acid - related - then perpetuated by epithelial injury related to the cough itself as the upper airway collapses on itself.  That is, the more sensitive the epithelium becomes once it is damaged by the virus, the more the ensuing irritability> the more the cough, the more the secondary reflux (especially in those prone to reflux) the more the irritation of the sensitive mucosa and so on in a  Classic cyclical pattern.    Also needs CT Sinus to complete the w/u  I had an extended discussion with the patient reviewing all relevant studies (including the 30 pages he handed me from recent w/u)completed to date and  lasting 25  minutes of a 45 minute new pt/  re-establish care visit    Each maintenance medication was reviewed in detail including most importantly the difference between maintenance and prns and under what circumstances the prns are to be triggered using an action plan format that is not reflected in the computer generated alphabetically organized AVS.    Please see instructions for details which were reviewed in writing and the patient given a copy highlighting the part that I personally wrote and discussed at today's ov.

## 2015-10-17 ENCOUNTER — Inpatient Hospital Stay: Admission: RE | Admit: 2015-10-17 | Payer: Medicare HMO | Source: Ambulatory Visit

## 2015-10-19 ENCOUNTER — Ambulatory Visit (INDEPENDENT_AMBULATORY_CARE_PROVIDER_SITE_OTHER)
Admission: RE | Admit: 2015-10-19 | Discharge: 2015-10-19 | Disposition: A | Discharge status: Home / Self Care | Payer: Medicare HMO | Source: Ambulatory Visit | Attending: Internal Medicine | Admitting: Internal Medicine

## 2015-10-19 DIAGNOSIS — R05 Cough: Secondary | ICD-10-CM | POA: Diagnosis not present

## 2015-11-08 ENCOUNTER — Telehealth: Payer: Self-pay | Admitting: Internal Medicine

## 2015-11-08 NOTE — Telephone Encounter (Signed)
lmtcb for pt.  

## 2015-11-09 NOTE — Telephone Encounter (Signed)
lmomtcb x1 

## 2015-11-09 NOTE — Telephone Encounter (Signed)
Spoke with pt. He is requesting the results from his CT that he had done on 10/19/15.  MW - please advise. Thanks.  Patient Instructions     Whenever coughing > nexium 40 Take 30-60 min before first meal of the day and pepcid ac 20 mg at bedtime   For cough > mucinex dm 1200 mg every 12 hours  Please see patient coordinator before you leave today to schedule sinus CT   GERD (REFLUX) is an extremely common cause of respiratory symptoms just like yours , many times with no obvious heartburn at all.   It can be treated with medication, but also with lifestyle changes including elevation of the head of your bed (ideally with 6 inch bed blocks), Smoking cessation, avoidance of late meals, excessive alcohol, and avoid fatty foods, chocolate, peppermint, colas, red wine, and acidic juices such as orange juice.  NO MINT OR MENTHOL PRODUCTS SO NO COUGH DROPS  USE SUGARLESS CANDY INSTEAD (Jolley ranchers or Stover's or Life Savers) or even ice chips will also do - the key is to swallow to prevent all throat clearing. NO OIL BASED VITAMINS - use powdered substitutes.

## 2015-11-09 NOTE — Telephone Encounter (Signed)
Sorry must have hit the wrong button when I reviewed it:    No sign findings, be sure has f/u ov if not doing better

## 2015-11-10 NOTE — Telephone Encounter (Signed)
Called spoke with pt. He is aware of below. Nothing further needed 

## 2015-11-10 NOTE — Telephone Encounter (Signed)
Ok to just use the inhalers if needed

## 2015-11-10 NOTE — Telephone Encounter (Signed)
Called and spoke to pt. Informed him of the results and recs per MW. Pt states he now feels 100% better but is questioning if he needs to be on a maintenance inhaler to keep him feeling well.   MW please advise. Thanks.

## 2016-03-22 ENCOUNTER — Encounter: Payer: Self-pay | Admitting: Physician Assistant

## 2017-03-25 ENCOUNTER — Encounter: Payer: Self-pay | Admitting: *Deleted

## 2017-12-15 ENCOUNTER — Ambulatory Visit: Payer: Medicare HMO | Admitting: Internal Medicine

## 2017-12-15 ENCOUNTER — Ambulatory Visit (INDEPENDENT_AMBULATORY_CARE_PROVIDER_SITE_OTHER)
Admission: RE | Admit: 2017-12-15 | Discharge: 2017-12-15 | Disposition: A | Discharge status: Home / Self Care | Payer: Medicare HMO | Source: Ambulatory Visit | Attending: Internal Medicine | Admitting: Internal Medicine

## 2017-12-15 ENCOUNTER — Encounter: Payer: Self-pay | Admitting: Internal Medicine

## 2017-12-15 VITALS — BP 140/80 | HR 72 | Ht 73.0 in | Wt 239.6 lb

## 2017-12-15 DIAGNOSIS — J449 Chronic obstructive pulmonary disease, unspecified: Secondary | ICD-10-CM | POA: Diagnosis not present

## 2017-12-15 DIAGNOSIS — B370 Candidal stomatitis: Secondary | ICD-10-CM | POA: Diagnosis not present

## 2017-12-15 DIAGNOSIS — B3781 Candidal esophagitis: Secondary | ICD-10-CM | POA: Diagnosis not present

## 2017-12-15 MED ORDER — BUDESONIDE-FORMOTEROL FUMARATE 80-4.5 MCG/ACT IN AERO: 2.0000 | INHALATION_SPRAY | Freq: Two times a day (BID) | RESPIRATORY_TRACT | 11 refills | Status: DC

## 2017-12-15 NOTE — Patient Instructions (Addendum)
Plan A = Automatic =    symbicort 80 2 pffs each am  And stop breo    Work on inhaler technique:  relax and gently blow all the way out then take a nice smooth deep breath back in, triggering the inhaler at same time you start breathing in.  Hold for up to 5 seconds if you can. Blow out thru nose. Rinse and gargle with water when done    Plan B = Backup Only use your albuterol as a rescue medication to be used if you can't catch your breath by resting or doing a relaxed purse lip breathing pattern.  - The less you use it, the better it will work when you need it. - Ok to use the inhaler up to 2 puffs  every 4 hours if you must but call for appointment if use goes up over your usual need - Don't leave home without it !!  (think of it like the spare tire for your car)   Plan C = Crisis - only use your albuterol nebulizer if you first try Plan B and it fails to help > ok to use the nebulizer up to every 4 hours but if start needing it regularly call for immediate appointment     Use up nystatin as you plan and call me if thrush not better   Please remember to go to the lab department downstairs in the basement  for your tests - we will call you with the results when they are available.       Please schedule a follow up office visit in 4 weeks, sooner if needed  with all medications /inhalers/ solutions in hand so we can verify exactly what you are taking. This includes all medications from all doctors and over the counters - pfts on return

## 2017-12-15 NOTE — Progress Notes (Signed)
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 rec review optimal use of saba and gerd diet, reconditioning and return for pfts     Jun 27 2010 ov cc doe - cough has resolved. He states that he spits up clear to yellow sputum in the am only then fine the rest of the day. .   Patient Instructions:  1) ok to take symbicort 160 2 puffs first thing in am and 2 puffs again in pm about 12 hours if needed  2) Work on inhaler technique: relax and blow all the way out then take a nice smooth deep breath back in, triggering the inhaler at same time you start breathing in and hold a few seconds then out thru the nose and brush teetch tongue and gargle  3) GERD diet reviewed    10/08/2011 f/u ov/Muhanad Torosyan cc doe only x steps,  Am sputum for one episode daily and done,  Using symbicort in am only due to cost.  Main issue is steps, does ok on flat surfaces but limited by back. rec Work on inhaler technique:    10/13/2015 new pt eval (> 3 y since last visit) ov/Laterica Matarazzo re:   COPD GOLD II  / no maint rx Chief Complaint  Patient presents with  . Follow-up    Pt states had episode of "choking" in the night 09/10/16- since then he as had cough on an off.  baseline = able to do eliptical x 30 m/ treadmill ok no inhalers at all  Acutely Dec 27th for 2016  p inhaling flavored mist = severe cough / hurt to cough diffusely bilateral lower / zpak/ steroids/ cough med / inhaler twice daily ventolin and some better with more day than noct coughing still but not productive rec Whenever coughing > nexium 40 Take 30-60 min before first meal of  the day and pepcid ac 20 mg at bedtime  For cough > mucinex dm 1200 mg every 12 hours Please see patient coordinator before you leave today  to schedule sinus CT  GERD diet     12/15/2017  Acute extended  ov/Corabelle Spackman re: re-establish  COPD II /recurrent thrush/ confused with meds from multiple providers on Breo 200 daily   Chief Complaint  Patient presents with  . Acute Visit    Pt c/o white film on his tongue. He states "I have thrush all the time".  He uses Dulera "sometimes" and rarely uses his albuterol inhaler. He uses his neb every am.   baseline Dyspnea:  MMRC1 = can walk nl pace, flat grade, can't hurry or go uphills or steps s sob   Cough: not now Sleep: ok  SABA use:  Daily neb Thrush some better on nystatin but keeps coming back   No obvious day to day or daytime variability or assoc excess/ purulent sputum or mucus plugs or hemoptysis or cp or chest tightness, subjective wheeze or overt sinus or hb symptoms. No unusual exposure hx or h/o childhood pna/ asthma or knowledge of premature birth.  Sleeping ok flat without nocturnal  or early am exacerbation  of respiratory  c/o's or need for noct saba. Also denies any obvious fluctuation of symptoms with weather or environmental changes or other aggravating or alleviating factors except as outlined above   Current Allergies, Complete Past Medical History, Past Surgical History, Family History, and Social History were reviewed in [facility] record.  ROS  The following are not active complaints unless bolded Hoarseness, sore throat, dysphagia, dental problems, itching, sneezing,  nasal congestion or discharge of excess mucus or purulent secretions, ear ache,   fever, chills, sweats, unintended wt loss or wt gain, classically pleuritic or exertional cp,  orthopnea pnd or leg swelling, presyncope, palpitations, abdominal pain, anorexia, nausea, vomiting, diarrhea  or change in bowel habits or change in bladder habits,  change in stools or change in urine, dysuria, hematuria,  rash, arthralgias, visual complaints, headache, numbness, weakness or ataxia or problems with walking or coordination,  change in mood/affect or memory.        Current Meds not able to verify   Medication Sig  . albuterol (PROVENTIL) (2.5 MG/3ML) 0.083% nebulizer solution Take 2.5 mg by nebulization every 6 (six) hours as needed for wheezing or shortness of breath.  Marland Kitchen albuterol (VENTOLIN HFA) 108 (90 Base) MCG/ACT inhaler Inhale 2 puffs into the lungs every 6 (six) hours as needed for wheezing or shortness of breath.  . diclofenac (VOLTAREN) 75 MG EC tablet Take 75 mg by mouth 2 (two) times daily.  Marland Kitchen esomeprazole (NEXIUM) 40 MG capsule Take 40 mg by mouth daily as needed.  . Fluticasone Furoate-Vilanterol (BREO ELLIPTA IN) Inhale 1 puff into the lungs daily.  . mometasone-formoterol (DULERA) 100-5 MCG/ACT AERO Inhale 2 puffs into the lungs 2 (two) times daily.  . pregabalin (LYRICA) 150 MG capsule Take 150 mg by mouth 3 (three) times daily.  . tadalafil (CIALIS) 5 MG tablet Take 5 mg by mouth daily.  Marland Kitchen tiZANidine (ZANAFLEX) 4 MG tablet Take 4 mg by mouth every 6 (six) hours as needed for muscle spasms.           Past Medical History:  Hyperlipidemia  COPD  - PFT's February 26, 2010 FEV1 1.97 (51%) ratio 49 with 41% improvement after B2, nl 80  - HFA 75% February 26, 2010 > 90% June 27, 2010          Objective:   Physical Exam   amb easily frustrated wm nad   Wt Readings from Last 3 Encounters:  12/15/17 239 lb 9.6 oz (108.7 kg)  10/13/15 234 lb (106.1 kg)  10/08/11 236 lb (107 kg)     Vital signs reviewed - Note on arrival 02 sats   93%  On RA     HEENT: nl dentition / . Nl external ear canals without cough reflex - mild bilateral non-specific turbinate edema  And mild thrush   NECK :  without JVD/Nodes/TM/ nl carotid upstrokes bilaterally   LUNGS: no acc muscle use,  Mild  barrel  contour chest wall with  bilateral  Distant bs s audible wheeze and  without cough on insp or exp maneuver and mod   Hyperresonant  to  percussion bilaterally     CV:  RRR  no s3 or murmur or increase in P2, and no edema   ABD:  soft and nontender with pos late  insp Hoover's  in the supine position. No bruits or organomegaly appreciated, bowel sounds nl  MS:   Nl  gait/  ext warm without deformities, calf tenderness, cyanosis or clubbing No obvious joint restrictions   SKIN: warm and dry without lesions    NEURO:  alert, approp, nl sensorium with  no motor or cerebellar deficits apparent.        CXR PA and Lateral:   12/15/2017 :    I personally reviewed images and agree with radiology impression as follows:    No active cardiopulmonary disease.  Labs ordered 12/15/2017  HIV/ allergy profile > did not go to lab as rec           Assessment:

## 2017-12-16 ENCOUNTER — Encounter: Payer: Self-pay | Admitting: Internal Medicine

## 2017-12-16 NOTE — Assessment & Plan Note (Addendum)
-   quit smoking 2009 - PFT's February 26, 2010 FEV1 1.97 (51%) ratio 49 with 41% improvement after B2, nl 80  - 12/15/2017  After extensive coaching inhaler device  effectiveness =    90% from a baseline of 75% > try change to symbicort 80 2 each am with pm dose optional as gets thrush from ICS   DDX of  difficult airways management almost all start with A and  include Adherence, Ace Inhibitors, Acid Reflux, Active Sinus Disease, Alpha 1 Antitripsin deficiency, Anxiety masquerading as Airways dz,  ABPA,  Allergy(esp in young), Aspiration (esp in elderly), Adverse effects of meds,  Active smokers, A bunch of PE's (a small clot burden can't cause this syndrome unless there is already severe underlying pulm or vascular dz with poor reserve) plus two Bs  = Bronchiectasis and Beta blocker use..and one C= CHF    Adherence is always the initial "prime suspect" and is a multilayered concern that requires a "trust but verify" approach in every patient - starting with knowing how to use medications, especially inhalers, correctly, keeping up with refills and understanding the fundamental difference between maintenance and prns vs those medications only taken for a very short course and then stopped and not refilled.  - see hfa teaching - easily confused with details of care/ instructions from multiple providers so must return with all meds in hand using a trust but verify approach to confirm accurate Medication  Reconciliation The principal here is that until we are certain that the  patients are doing what we've asked, it makes no sense to ask them to do more.   ? Allergy/asthma> check allergy profile (did not go to lab as req) -  Based on the study from NEJM  378; 20 p 1865 (2018) in pts with mild asthma it is reasonable to use low dose symbicort eg 80 2bid "prn" flare in this setting but I emphasized this was only shown with symbicort and takes advantage of the rapid onset of action but is not the same as "rescue  therapy" but can be stopped once the acute symptoms have resolved and the need for rescue has been minimized (< 2 x weekly)    ? Acid (or non-acid) GERD > always difficult to exclude as up to 75% of pts in some series report no assoc GI/ Heartburn symptoms> rec continue max (24h)  acid suppression and diet restrictions/ reviewed      Return in 4 weeks with full pfts    I had an extended discussion with the patient reviewing all relevant studies completed to date and  lasting 15 to 20 minutes of a 25 minute visit    Each maintenance medication was reviewed in detail including most importantly the difference between maintenance and prns and under what circumstances the prns are to be triggered using an action plan format that is not reflected in the computer generated alphabetically organized AVS.    Please see AVS for specific instructions unique to this visit that I personally wrote and verbalized to the the pt in detail and then reviewed with pt  by my nurse highlighting any  changes in therapy recommended at today's visit to their plan of care.

## 2017-12-16 NOTE — Assessment & Plan Note (Signed)
Changed breo 200 to symb 80 2 q am 12/15/2017  - HIV 12/15/2017 >>> did not go to lab   [facility] to use nystatin but if not resolved may need course of clotrimzole or diflucan

## 2017-12-16 NOTE — Progress Notes (Signed)
Spoke with pt and notified of results per Dr. Wert. Pt verbalized understanding and denied any questions. 

## 2018-01-12 ENCOUNTER — Ambulatory Visit (INDEPENDENT_AMBULATORY_CARE_PROVIDER_SITE_OTHER): Payer: Medicare HMO | Admitting: Internal Medicine

## 2018-01-12 ENCOUNTER — Ambulatory Visit: Payer: Medicare HMO | Admitting: Internal Medicine

## 2018-01-12 ENCOUNTER — Encounter: Payer: Self-pay | Admitting: Internal Medicine

## 2018-01-12 ENCOUNTER — Other Ambulatory Visit (INDEPENDENT_AMBULATORY_CARE_PROVIDER_SITE_OTHER): Payer: Medicare HMO

## 2018-01-12 VITALS — BP 130/74 | HR 83 | Ht 72.75 in | Wt 234.0 lb

## 2018-01-12 DIAGNOSIS — R05 Cough: Secondary | ICD-10-CM

## 2018-01-12 DIAGNOSIS — J449 Chronic obstructive pulmonary disease, unspecified: Secondary | ICD-10-CM | POA: Diagnosis not present

## 2018-01-12 DIAGNOSIS — B3781 Candidal esophagitis: Secondary | ICD-10-CM

## 2018-01-12 DIAGNOSIS — B370 Candidal stomatitis: Secondary | ICD-10-CM

## 2018-01-12 DIAGNOSIS — R058 Other specified cough: Secondary | ICD-10-CM

## 2018-01-12 LAB — PULMONARY FUNCTION TEST
DL/VA % pred: 111 %
DL/VA: 5.32 ml/min/mmHg/L
DLCO unc % pred: 82 %
DLCO unc: 29.85 ml/min/mmHg
FEF 25-75 Post: 1.99 L/sec
FEF 25-75 Pre: 0.83 L/sec
FEF2575-%Change-Post: 140 %
FEF2575-%Pred-Post: 62 %
FEF2575-%Pred-Pre: 25 %
FEV1-%Change-Post: 42 %
FEV1-%Pred-Post: 59 %
FEV1-%Pred-Pre: 41 %
FEV1-Post: 2.35 L
FEV1-Pre: 1.64 L
FEV1FVC-%Change-Post: 6 %
FEV1FVC-%Pred-Pre: 70 %
FEV6-%Change-Post: 36 %
FEV6-%Pred-Post: 80 %
FEV6-%Pred-Pre: 59 %
FEV6-Post: 4.03 L
FEV6-Pre: 2.96 L
FEV6FVC-%Change-Post: 1 %
FEV6FVC-%Pred-Post: 102 %
FEV6FVC-%Pred-Pre: 100 %
FVC-%Change-Post: 33 %
FVC-%Pred-Post: 78 %
FVC-%Pred-Pre: 58 %
FVC-Post: 4.08 L
FVC-Pre: 3.06 L
Post FEV1/FVC ratio: 57 %
Post FEV6/FVC ratio: 99 %
Pre FEV1/FVC ratio: 54 %
Pre FEV6/FVC Ratio: 97 %
RV % pred: 265 %
RV: 6.38 L
TLC % pred: 126 %
TLC: 9.59 L

## 2018-01-12 LAB — CBC WITH DIFFERENTIAL/PLATELET
Basophils Absolute: 0.1 10*3/uL (ref 0.0–0.1)
Basophils Relative: 0.9 % (ref 0.0–3.0)
Eosinophils Absolute: 0.2 10*3/uL (ref 0.0–0.7)
Eosinophils Relative: 2.6 % (ref 0.0–5.0)
HCT: 44 % (ref 39.0–52.0)
Hemoglobin: 14.9 g/dL (ref 13.0–17.0)
Lymphocytes Relative: 31.7 % (ref 12.0–46.0)
Lymphs Abs: 1.9 10*3/uL (ref 0.7–4.0)
MCHC: 33.9 g/dL (ref 30.0–36.0)
MCV: 89.5 fl (ref 78.0–100.0)
Monocytes Absolute: 0.5 10*3/uL (ref 0.1–1.0)
Monocytes Relative: 8.8 % (ref 3.0–12.0)
Neutro Abs: 3.4 10*3/uL (ref 1.4–7.7)
Neutrophils Relative %: 56 % (ref 43.0–77.0)
Platelets: 394 10*3/uL (ref 150.0–400.0)
RBC: 4.91 Mil/uL (ref 4.22–5.81)
RDW: 12.9 % (ref 11.5–15.5)
WBC: 6.1 10*3/uL (ref 4.0–10.5)

## 2018-01-12 NOTE — Progress Notes (Signed)
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 Follow-up    Pt states had episode of "choking" in the night 09/10/16- since then he as had cough on an off.  baseline = able to do eliptical x 30 m/ treadmill ok no inhalers at all  Acutely Dec 27th for 2016  p inhaling flavored mist = severe cough / hurt to cough diffusely bilateral lower / zpak/ steroids/ cough med / inhaler twice daily ventolin and some better with more day than noct coughing still but not productive rec Whenever coughing > nexium 40 Take 30-60 min before first meal of the day and pepcid ac 20 mg at bedtime  For cough > mucinex dm 1200 mg every 12 hours Please see patient coordinator before you leave today  to schedule sinus CT  GERD diet     12/15/2017  Acute extended  ov/Calven Gilkes re: re-establish  COPD II /recurrent thrush/ confused with meds from multiple providers on Breo 200 daily   Chief Complaint  Patient presents with  . Acute Visit    Pt c/o white film on his tongue. He states "I have thrush all the time".  He uses Dulera "sometimes" and rarely uses his albuterol inhaler. He uses his neb every am.   baseline Dyspnea:  MMRC1 = can walk nl pace, flat grade, can't hurry or go uphills or steps s sob   Cough: not now Sleep: ok  SABA use:  Daily neb Thrush some better on nystatin but keeps coming back  rec Plan A = Automatic =    symbicort 80 2 pffs each am  And stop breo   Work on inhaler technique:  relax and gently blow all the way out  Plan B =  Backup Only use your albuterol as a rescue medication  Plan C = Crisis - only use your albuterol nebulizer if you first try Plan B Use up nystatin as you plan and call me if thrush not better > resolved      01/12/2018  f/u ov/Alisse Tuite re:  GOLD II copd  Chief Complaint  Patient presents with  . Follow-up    PFT's done today. Breathing is doing well. He has not needed his albuterol inhaler or neb.   Dyspnea:  Carrying groceries/not exerting otherwise  Cough: none Sleep: no resp cc SABA use:  None   No obvious day to day or daytime variability or assoc excess/ purulent sputum or mucus plugs or hemoptysis or cp or chest tightness, subjective wheeze or overt sinus or hb symptoms. No unusual exposure hx or h/o childhood pna/ asthma or knowledge of premature birth.  Sleeping  Ok flat   without nocturnal  or early am exacerbation  of respiratory  c/o's or need for noct saba. Also denies any obvious fluctuation of symptoms with weather or environmental changes or other aggravating or alleviating factors except as outlined above  Current Allergies, Complete Past Medical History, Past Surgical History, Family History, and Social History were reviewed in [employer] record.  ROS  The following are not active complaints unless bolded Hoarseness, sore throat, dysphagia, dental problems, itching, sneezing,  nasal congestion or discharge of excess mucus or purulent secretions, ear ache,   fever, chills, sweats, unintended wt loss or wt gain, classically pleuritic or exertional cp,  orthopnea pnd or arm/hand swelling  or leg swelling, presyncope, palpitations, abdominal pain, anorexia, nausea, vomiting, diarrhea  or change in bowel habits or change in bladder habits, change in stools or change in urine, dysuria, hematuria,  rash, arthralgias, visual complaints, headache, numbness, weakness or ataxia or problems with walking or coordination,  change in mood or  memory.        Current  Meds  Medication Sig  . albuterol (PROVENTIL) (2.5 MG/3ML) 0.083% nebulizer solution Take 2.5 mg by nebulization every 6 (six) hours as needed for wheezing or shortness of breath.  Marland Kitchen albuterol (VENTOLIN HFA) 108 (90 Base) MCG/ACT inhaler Inhale 2 puffs into the lungs every 6 (six) hours as needed for wheezing or shortness of breath.  . Ascorbic Acid (VITAMIN C) 1000 MG tablet Take 1,000 mg by mouth 2 (two) times daily.  Marland Kitchen aspirin EC 81 MG tablet Take 81 mg by mouth daily.  . diclofenac (VOLTAREN) 75 MG EC tablet Take 75 mg by mouth 2 (two) times daily.  Marland Kitchen esomeprazole (NEXIUM) 40 MG capsule Take 40 mg by mouth daily as needed.  . Misc Natural Products (PROSTATE HEALTH) CAPS Take 1 capsule by mouth daily.  Marland Kitchen nystatin (MYCOSTATIN) 100000 UNIT/ML suspension Take 5 mLs by mouth 4 (four) times daily.  . pregabalin (LYRICA) 150 MG capsule Take 150 mg by mouth 3 (three) times daily.  . tadalafil (CIALIS) 5 MG tablet Take 5 mg by mouth daily.  Marland Kitchen tiZANidine (ZANAFLEX) 4 MG tablet Take 4 mg by mouth every 6 (six) hours as needed for muscle spasms.  . traMADol (ULTRAM) 50 MG tablet Take 50 mg by mouth every 6 (six) hours as needed.  Marland Kitchen UNABLE TO FIND Med Name: core complex for cholesterol  . UNABLE TO FIND Med Name: lung, sinus and bronchial health caps otc daily             Past Medical History:  Hyperlipidemia  COPD  - PFT's February 26, 2010 FEV1 1.97 (51%) ratio 49 with 41% improvement after B2, nl 80  - HFA 75% February 26, 2010 > 90% June 27, 2010          Objective:   Physical Exam  amb wm nad     01/12/2018      234   12/15/17 239 lb 9.6 oz (108.7 kg)  10/13/15 234 lb (106.1 kg)  10/08/11 236 lb (107 kg)     Vital signs reviewed - Note on arrival 02 sats  94% on RA        HEENT: nl dentition / oropharynx. Nl external ear canals without cough reflex - moderate bilateral non-specific turbinate edema     NECK :  without JVD/Nodes/TM/ nl carotid upstrokes  bilaterally   LUNGS: no acc muscle use,  Mild barrel  contour chest wall with bilateral sltly  Distant bs s audible wheeze and  without cough on insp or exp maneuver and mild   Hyperresonant  to  percussion bilaterally     CV:  RRR  no s3 or murmur or increase in P2, and no  edema   ABD:  soft and nontender with pos late insp Hoover's  in the supine position. No bruits or organomegaly appreciated, bowel sounds nl  MS:   Nl gait/  ext warm without deformities, calf tenderness, cyanosis or clubbing No obvious joint restrictions   SKIN: warm and dry without lesions    NEURO:  alert, approp, nl sensorium with  no motor or cerebellar deficits apparent.                Labs ordered 01/12/2018   HIV/ allergy profile               Assessment:

## 2018-01-12 NOTE — Progress Notes (Signed)
PFT done today. 

## 2018-01-12 NOTE — Patient Instructions (Addendum)
Plan A = Automatic =    symbicort 80 2 pffs each am       Plan B = Backup Only use your albuterol as a rescue medication to be used if you can't catch your breath by resting or doing a relaxed purse lip breathing pattern.  - The less you use it, the better it will work when you need it. - Ok to use the inhaler up to 2 puffs  every 4 hours if you must but call for appointment if use goes up over your usual need - Don't leave home without it !!  (think of it like the spare tire for your car)    Plan C = Crisis - only use your albuterol nebulizer if you first try Plan B and it fails to help > ok to use the nebulizer up to every 4 hours but if start needing it regularly call for immediate appointment   Please remember to go to the lab department downstairs in the basement  for your tests - we will call you with the results when they are available.  Please schedule a follow up visit in 3 months but call sooner if needed

## 2018-01-13 ENCOUNTER — Encounter: Payer: Self-pay | Admitting: Internal Medicine

## 2018-01-13 LAB — RESPIRATORY ALLERGY PROFILE REGION II ~~LOC~~
Allergen, A. alternata, m6: <0.1 kU/L
Allergen, Cedar tree, t12: <0.1 kU/L
Allergen, Comm Silver Birch, t9: <0.1 kU/L
Allergen, Cottonwood, t14: <0.1 kU/L
Allergen, D pternoyssinus,d7: <0.1 kU/L
Allergen, Mouse Urine Protein, e78: <0.1 kU/L
Allergen, Mulberry, t76: <0.1 kU/L
Allergen, Oak,t7: <0.1 kU/L
Allergen, P. notatum, m1: <0.1 kU/L
Aspergillus fumigatus, m3: <0.1 kU/L
Bermuda Grass: 3.05 kU/L — ABNORMAL HIGH
Box Elder IgE: <0.1 kU/L
CLADOSPORIUM HERBARUM (M2) IGE: <0.1 kU/L
COMMON RAGWEED (SHORT) (W1) IGE: 0.28 kU/L — ABNORMAL HIGH
Cat Dander: <0.1 kU/L
Class: 0
Class: 0
Class: 0
Class: 0
Class: 0
Class: 0
Class: 0
Class: 0
Class: 0
Class: 0
Class: 0
Class: 0
Class: 0
Class: 0
Class: 0
Class: 0
Class: 0
Class: 0
Class: 0
Class: 0
Class: 0
Class: 2
Class: 2
Class: 3
Cockroach: 0.11 kU/L — ABNORMAL HIGH
D. farinae: <0.1 kU/L
Dog Dander: <0.1 kU/L
Elm IgE: <0.1 kU/L
IgE (Immunoglobulin E), Serum: 23 kU/L (ref <=114)
Johnson Grass: 1.64 kU/L — ABNORMAL HIGH
Pecan/Hickory Tree IgE: <0.1 kU/L
Rough Pigweed  IgE: <0.1 kU/L
Sheep Sorrel IgE: <0.1 kU/L
Timothy Grass: 4.1 kU/L — ABNORMAL HIGH

## 2018-01-13 LAB — INTERPRETATION:

## 2018-01-13 NOTE — Assessment & Plan Note (Signed)
-   quit smoking 2009 - PFT's February 26, 2010 FEV1 1.97 (51%) ratio 49 with 41% improvement after B2, nl 80  - 12/15/2017  After extensive coaching inhaler device  effectiveness =    90% from a baseline of 75% > try change to symbicort 80 2 each am with pm dose optional as gets thrush from ICS  -  PFT's  01/12/2018  FEV1 2.35 (59 % ) ratio 57  p 42 % improvement from saba p nothing prior to study with DLCO  82 % corrects to 111 % for alv volume  And erv 46%  01/12/2018  After extensive coaching inhaler device  effectiveness =    90% > resume symb 80 x 2 each am and rinse and gargle p use  He has mild/mod copd and is minimally symptomatic due to limit by back and would probably benefit from low dose ICS/ laba (high doses caused thrush) so reasonable to try symb 80 2 each am.  I had an extended discussion with the patient reviewing all relevant studies completed to date and  lasting 15 to 20 minutes of a 25 minute visit    Each maintenance medication was reviewed in detail including most importantly the difference between maintenance and prns and under what circumstances the prns are to be triggered using an action plan format that is not reflected in the computer generated alphabetically organized AVS.    Please see AVS for specific instructions unique to this visit that I personally wrote and verbalized to the the pt in detail and then reviewed with pt  by my nurse highlighting any  changes in therapy recommended at today's visit to their plan of care.

## 2018-01-13 NOTE — Assessment & Plan Note (Signed)
Sinus ct  10/19/15> 1. Minimal frontal sinus disease, right greater than left. 2. The paranasal sinuses are otherwise clear.  - Allergy profile 01/12/2018 >  Eos 0.2 /  IgE pending

## 2018-01-13 NOTE — Assessment & Plan Note (Signed)
Changed breo 200 to symb 80 2 q am 12/15/2017  - HIV 01/12/2018 pending   Has resolved on nystatin but has not tried ICS again in above form, suggested he do so and let me know if thrush recurs using good technique with HFA as per instructions re oral hygiene

## 2018-01-21 ENCOUNTER — Telehealth: Payer: Self-pay | Admitting: Internal Medicine

## 2018-01-21 NOTE — Telephone Encounter (Signed)
Spoke with pt. He was inquiring about his HIV antibody lab work. Advised him that this test was never performed. The order was placed on 12/15/17 but the lab never drew it. Pt will come by the office and have this done. Nothing further was needed.

## 2018-04-20 ENCOUNTER — Ambulatory Visit: Payer: Medicare HMO | Admitting: Internal Medicine

## 2018-05-11 ENCOUNTER — Ambulatory Visit: Payer: Medicare HMO | Admitting: Internal Medicine

## 2018-05-16 ENCOUNTER — Other Ambulatory Visit: Payer: Self-pay | Admitting: Family Medicine

## 2018-05-16 DIAGNOSIS — R1011 Right upper quadrant pain: Secondary | ICD-10-CM

## 2018-05-22 ENCOUNTER — Ambulatory Visit
Admission: RE | Admit: 2018-05-22 | Discharge: 2018-05-22 | Disposition: A | Discharge status: Home / Self Care | Payer: Medicare HMO | Source: Ambulatory Visit | Attending: Family Medicine | Admitting: Family Medicine

## 2018-05-22 DIAGNOSIS — R1011 Right upper quadrant pain: Secondary | ICD-10-CM

## 2020-08-31 ENCOUNTER — Other Ambulatory Visit: Payer: Self-pay | Admitting: Urology

## 2020-08-31 DIAGNOSIS — C61 Malignant neoplasm of prostate: Secondary | ICD-10-CM

## 2020-09-28 ENCOUNTER — Ambulatory Visit
Admission: RE | Admit: 2020-09-28 | Discharge: 2020-09-28 | Disposition: A | Discharge status: Home / Self Care | Payer: Medicare HMO | Source: Ambulatory Visit | Attending: Urology | Admitting: Urology

## 2020-09-28 DIAGNOSIS — C61 Malignant neoplasm of prostate: Secondary | ICD-10-CM

## 2020-09-28 MED ORDER — GADOBENATE DIMEGLUMINE 529 MG/ML IV SOLN
20.0000 mL | Freq: Once | INTRAVENOUS | Status: AC | PRN
  Administered 2020-09-28: 20 mL via INTRAVENOUS

## 2021-01-02 ENCOUNTER — Other Ambulatory Visit: Payer: Self-pay

## 2021-01-02 ENCOUNTER — Emergency Department (HOSPITAL_COMMUNITY): Payer: Medicare HMO

## 2021-01-02 ENCOUNTER — Encounter (HOSPITAL_COMMUNITY): Payer: Self-pay | Admitting: Emergency Medicine

## 2021-01-02 ENCOUNTER — Emergency Department (HOSPITAL_COMMUNITY)
Admission: EM | Admit: 2021-01-02 | Discharge: 2021-01-02 | Disposition: A | Discharge status: Home / Self Care | Payer: Medicare HMO | Attending: Emergency Medicine | Admitting: Emergency Medicine

## 2021-01-02 DIAGNOSIS — R200 Anesthesia of skin: Secondary | ICD-10-CM | POA: Diagnosis not present

## 2021-01-02 DIAGNOSIS — Z87891 Personal history of nicotine dependence: Secondary | ICD-10-CM | POA: Insufficient documentation

## 2021-01-02 DIAGNOSIS — J449 Chronic obstructive pulmonary disease, unspecified: Secondary | ICD-10-CM | POA: Diagnosis not present

## 2021-01-02 DIAGNOSIS — Z79899 Other long term (current) drug therapy: Secondary | ICD-10-CM | POA: Diagnosis not present

## 2021-01-02 DIAGNOSIS — M79645 Pain in left finger(s): Secondary | ICD-10-CM | POA: Diagnosis present

## 2021-01-02 HISTORY — DX: Malignant (primary) neoplasm, unspecified: C80.1

## 2021-01-02 MED ORDER — TETANUS-DIPHTH-ACELL PERTUSSIS 5-2.5-18.5 LF-MCG/0.5 IM SUSY
0.5000 mL | PREFILLED_SYRINGE | Freq: Once | INTRAMUSCULAR | Status: DC
  Filled 2021-01-02: qty 0.5

## 2021-01-02 NOTE — ED Triage Notes (Signed)
Pt c/o pain and numbness to his left index finger after injuring it with a steak knife x 1 week ago.

## 2021-01-02 NOTE — Discharge Instructions (Signed)
Keep follow-up appoint with hand surgery  Return for any worsening symptoms such as redness, swelling, decreased range of motion to your finger.

## 2021-01-02 NOTE — ED Provider Notes (Signed)
[facility] EMERGENCY DEPARTMENT Provider Note   CSN: [id number] Arrival date & time: 01/02/21  1431     History Chief Complaint  Patient presents with  . Hand Pain    Adam Hess is a 65 y.o. male with past medical history significant for COPD who presents for evaluation of left index finger pain.  States approximately 1 week ago he was slicing and avocado.  He had a puncture to his PIP on the medial aspect.  He was not evaluated at that time.  Patient states not noted any redness, swelling or warmth to his digit.  He states his finger feels numb on the medial aspect of his left index finger from PIP distally.  Unknown last tetanus.  He has appointment on Thursday with Dr. Doy Mince with hand surgery.  No fever, chills, nausea, vomiting, decreased range of motion.  Denies additional rating or alleviating factors.  History obtained from patient and past medical records.  No interpreter used.  HPI     Past Medical History:  Diagnosis Date  . Cancer [facility])    prostate  . COPD (chronic obstructive pulmonary disease) ([place])   . Hyperlipidemia     Patient Active Problem List   Diagnosis Date Noted  . Thrush of mouth and esophagus ([place]) 12/15/2017  . Upper airway cough syndrome 10/13/2015  . HYPERLIPIDEMIA 01/16/2010  . COPD GOLD II  01/16/2010  . WEIGHT GAIN, ABNORMAL 01/16/2010    Past Surgical History:  Procedure Laterality Date  . BACK SURGERY  July 2009   L-4, L-5       Family History  Problem Relation Age of Onset  . Cancer Mother        ? type  . Heart disease Father   . Allergies Mother     Social History   Tobacco Use  . Smoking status: Former Smoker    Packs/day: 1.00    Years: 30.00    Pack years: 30.00    Types: Cigarettes    Quit date: 09/17/2007    Years since quitting: 13.3  . Smokeless tobacco: Never Used  Substance Use Topics  . Alcohol use: Yes    Alcohol/week: 2.0 standard drinks    Types: 2 Standard drinks or  equivalent per week    Home Medications Prior to Admission medications   Medication Sig Start Date End Date Taking? Authorizing Provider  albuterol (PROVENTIL) (2.5 MG/3ML) 0.083% nebulizer solution Take 2.5 mg by nebulization every 6 (six) hours as needed for wheezing or shortness of breath.    [provider]  albuterol (VENTOLIN HFA) 108 (90 Base) MCG/ACT inhaler Inhale 2 puffs into the lungs every 6 (six) hours as needed for wheezing or shortness of breath.    [provider]  Ascorbic Acid (VITAMIN C) 1000 MG tablet Take 1,000 mg by mouth 2 (two) times daily.    [provider]  aspirin EC 81 MG tablet Take 81 mg by mouth daily.    [provider]  budesonide-formoterol (SYMBICORT) 80-4.5 MCG/ACT inhaler Inhale 2 puffs into the lungs 2 (two) times daily. Patient not taking: Reported on 01/12/2018 12/15/17   Tanda Rockers, MD  diclofenac (VOLTAREN) 75 MG EC tablet Take 75 mg by mouth 2 (two) times daily.    [provider]  esomeprazole (NEXIUM) 40 MG capsule Take 40 mg by mouth daily as needed.    [provider]  Misc Natural Products (PROSTATE HEALTH) CAPS Take 1 capsule by mouth daily.  [provider]  nystatin (MYCOSTATIN) 100000 UNIT/ML suspension Take 5 mLs by mouth 4 (four) times daily.    [provider]  pregabalin (LYRICA) 150 MG capsule Take 150 mg by mouth 3 (three) times daily.    [provider]  tadalafil (CIALIS) 5 MG tablet Take 5 mg by mouth daily.    [provider]  tiZANidine (ZANAFLEX) 4 MG tablet Take 4 mg by mouth every 6 (six) hours as needed for muscle spasms.    [provider]  traMADol (ULTRAM) 50 MG tablet Take 50 mg by mouth every 6 (six) hours as needed.    [provider]  UNABLE TO FIND Med Name: core complex for cholesterol    [provider]  UNABLE TO FIND Med Name: lung, sinus and bronchial health caps otc daily    [provider]    Allergies    Sporanos [itraconazole], Levofloxacin, and Penicillins  Review of Systems   Review of Systems  Constitutional: Negative.   HENT: Negative.   Respiratory: Negative.   Cardiovascular: Negative.   Gastrointestinal: Negative.   Genitourinary: Negative.   Musculoskeletal: Negative.   Skin: Negative.   Neurological: Positive for numbness. Negative for weakness.  All other systems reviewed and are negative.   Physical Exam Updated Vital Signs BP 137/90 (BP Location: Right Arm)   Pulse 72   Temp 98.4 F (36.9 C)   Resp 16   Ht 5\' 10"  (1.778 m)   Wt 106 kg   SpO2 97%   BMI 33.53 kg/m   Physical Exam Vitals and nursing note reviewed.  Constitutional:      General: He is not in acute distress.    Appearance: He is well-developed. He is not ill-appearing, toxic-appearing or diaphoretic.  HENT:     Head: Normocephalic and atraumatic.     Mouth/Throat:     Mouth: Mucous membranes are moist.  Eyes:     Pupils: Pupils are equal, round, and reactive to light.  Cardiovascular:     Rate and Rhythm: Normal rate and regular rhythm.     Pulses: Normal pulses.     Heart sounds: Normal heart sounds.  Pulmonary:     Effort: Pulmonary effort is normal. No respiratory distress.     Breath sounds: Normal breath sounds.  Abdominal:     General: There is no distension.     Palpations: Abdomen is soft.  Musculoskeletal:        General: Normal range of motion.     Cervical back: Normal range of motion and neck supple.     Comments: Puncture wound to left index finger at medial aspect PIP.  No erythema, warmth, fusiform swelling.  Able to flex and extend at all digits without difficulty.  Skin:    General: Skin is warm and dry.     Capillary Refill: Capillary refill takes less than 2 seconds.  Neurological:     Mental Status: He is alert.     Comments: Decree sensation to medial aspect left index finger from DIP distal Equal handgrip bilaterally     ED Results /  Procedures / Treatments   Labs (all labs ordered are listed, but only abnormal results are displayed) Labs Reviewed - No data to display  EKG None  Radiology DG Finger Index Left  Result Date: 01/02/2021 CLINICAL DATA:  Pain and numbness of the left index finger. Injured with a knife 1 week ago. EXAM: LEFT INDEX FINGER 2+V COMPARISON:  None. 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 No obvious erosive changes. The PIP joint and MCP joint are maintained. No acute bony findings or destructive bony changes. Diffuse soft tissue swelling is noted but no definite gas is seen in the soft tissues and no radiopaque foreign body is identified. IMPRESSION: 1. 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 2. No acute bony findings or radiopaque foreign body. 3. Diffuse soft tissue swelling. Electronically Signed   By: Marijo Sanes M.D.   On: 01/02/2021 16:38   Procedures Procedures   Medications Ordered in ED Medications  Tdap (BOOSTRIX) injection 0.5 mL (0.5 mLs Intramuscular Patient Refused/Not Given 01/02/21 1710)   ED Course  I have reviewed the triage vital signs and the nursing notes.  Pertinent labs & imaging results that were available during my care of the patient were reviewed by me and considered in my medical decision making (see chart for details).   Patient here for evaluation of tenderness after puncture would which occurred 1 week ago.  Puncture wound to medial aspect left index finger PIP.  He is afebrile, nonseptic, not ill-appearing.  Unknown last tetanus, will update.  He was not assessed at time of injury 1 week ago.  Has follow-up appointment with hand surgery in 2 days, Dr. Doy Mince.  Patient has no fusiform swelling.  Has full range of motion to index finger.  No bony tenderness to digits or hand. No tenderness over flexor tendon sheath. Low suspicion for flexor tendon synovitis. Xray  here without fracture or dislocation. Question if patients paresthesias to medial aspect digit is from possible nerve injury from puncture wound.  Is good follow-up.  Given wound does not appear actively infected do not feel needs antibiotics at this time.  Discussed strict return precautions.  He will return for any worsening symptoms and keep FU appointment with hand surgery in 2 days.  The patient has been appropriately medically screened and/or stabilized in the ED. I have low suspicion for any other emergent medical condition which would require further screening, evaluation or treatment in the ED or require inpatient management.  Patient is hemodynamically stable and in no acute distress.  Patient able to ambulate in department prior to ED.  Evaluation does not show acute pathology that would require ongoing or additional emergent interventions while in the emergency department or further inpatient treatment.  I have discussed the diagnosis with the patient and answered all questions.  Pain is been managed while in the emergency department and patient has no further complaints prior to discharge.  Patient is comfortable with plan discussed in room and is stable for discharge at this time.  I have discussed strict return precautions for returning to the emergency department.  Patient was encouraged to follow-up with PCP/specialist refer to at discharge.    MDM Rules/Calculators/A&P                           Final Clinical Impression(s) / ED Diagnoses Final diagnoses:  Numbness of finger    Rx / DC Orders ED Discharge Orders    None       Emidio Warrell A, PA-C 01/02/21 1715    Charlesetta Shanks, MD 01/16/21 1454

## 2021-01-02 NOTE — ED Triage Notes (Signed)
Emergency Medicine Provider Triage Evaluation Note  Adam Hess , a 64 y.o. male  was evaluated in triage.  Pt complains of finger injury. Was cutting an avocado and knife injured L index finger, states that punctured to the bone. Did not see anyone. Now complaining of finger pain and numbness on that digit. Unknown last tetanus.   Review of Systems  Positive: Finger injury , numbness  Negative:   Physical Exam  BP 137/90 (BP Location: Right Arm)   Pulse 72   Temp 98.4 F (36.9 C)   Resp 16   SpO2 97%  Gen:   Awake, no distress   HEENT:  Atraumatic  Resp:  Normal effort  Cardiac:  Normal rate  Abd:   Nondistended, nontender  MSK:   L finger with pain to puncture sight. Does not look infected.  Neuro:  Speech clear   Medical Decision Making  Medically screening exam initiated at 3:15 PM.  Appropriate orders placed.  Luciano Cinquemani was informed that the remainder of the evaluation will be completed by another provider, this initial triage assessment does not replace that evaluation, and the importance of remaining in the ED until their evaluation is complete.  Clinical Impression  Stable   MSE was initiated and I personally evaluated the patient and placed orders (if any) at  3:16 PM on January 02, 2021.  The patient appears stable so that the remainder of the MSE may be completed by another provider.    Alfredia Client, PA-C 01/02/21 [phone]

## 2021-03-27 ENCOUNTER — Encounter: Payer: Self-pay | Admitting: Medical Oncology

## 2021-04-03 ENCOUNTER — Encounter: Payer: Self-pay | Admitting: Medical Oncology

## 2021-04-03 NOTE — Progress Notes (Signed)
Mailed packet of information about PMDC to patient for his appointment 7/26.

## 2021-04-03 NOTE — Progress Notes (Signed)
I called pt to introduce myself as the Prostate Nurse Navigator and the Coordinator of the Prostate [place].  1. I confirmed with the patient he is aware of his referral to the clinic 7/26, arriving at 12:30 PM.  2. I discussed the format of the clinic and the physicians he will be seeing that day.  3. I discussed where the clinic is located and how to contact me.  4. I informed him, I mailed a packet of information and forms to be completed. I asked him to bring them with him the day of his appointment. He states he is out of town and will completed when he returns on Monday.   He voiced understanding of the above. I asked him to call me if he has any questions or concerns regarding his appointments or the forms he needs to complete.

## 2021-04-09 ENCOUNTER — Encounter: Payer: Self-pay | Admitting: Medical Oncology

## 2021-04-09 NOTE — Progress Notes (Signed)
Left message with [facility] appointment reminder for 7/26, arriving @ 12:30 pm. I reviewed location, COVID protocol and reminded him to bring completed medical forms to clinic. I asked him to call me back with questions or concerns.

## 2021-04-10 ENCOUNTER — Ambulatory Visit
Admission: RE | Admit: 2021-04-10 | Discharge: 2021-04-10 | Disposition: A | Discharge status: Home / Self Care | Payer: Medicare HMO | Source: Ambulatory Visit | Attending: Radiation Oncology | Admitting: Radiation Oncology

## 2021-04-10 ENCOUNTER — Encounter: Payer: Self-pay | Admitting: Medical Oncology

## 2021-04-10 ENCOUNTER — Encounter: Payer: Self-pay | Admitting: General Practice

## 2021-04-10 ENCOUNTER — Inpatient Hospital Stay: Payer: Medicare HMO | Attending: Oncology | Admitting: Oncology

## 2021-04-10 ENCOUNTER — Other Ambulatory Visit: Payer: Self-pay

## 2021-04-10 VITALS — BP 129/76 | HR 81 | Temp 97.8°F | Resp 18 | Ht 73.0 in | Wt 250.4 lb

## 2021-04-10 DIAGNOSIS — Z87891 Personal history of nicotine dependence: Secondary | ICD-10-CM

## 2021-04-10 DIAGNOSIS — J449 Chronic obstructive pulmonary disease, unspecified: Secondary | ICD-10-CM | POA: Insufficient documentation

## 2021-04-10 DIAGNOSIS — Z7982 Long term (current) use of aspirin: Secondary | ICD-10-CM | POA: Insufficient documentation

## 2021-04-10 DIAGNOSIS — Z79899 Other long term (current) drug therapy: Secondary | ICD-10-CM | POA: Diagnosis not present

## 2021-04-10 DIAGNOSIS — Z7951 Long term (current) use of inhaled steroids: Secondary | ICD-10-CM | POA: Insufficient documentation

## 2021-04-10 DIAGNOSIS — N529 Male erectile dysfunction, unspecified: Secondary | ICD-10-CM | POA: Diagnosis not present

## 2021-04-10 DIAGNOSIS — C61 Malignant neoplasm of prostate: Secondary | ICD-10-CM | POA: Insufficient documentation

## 2021-04-10 DIAGNOSIS — E785 Hyperlipidemia, unspecified: Secondary | ICD-10-CM | POA: Diagnosis not present

## 2021-04-10 DIAGNOSIS — Z923 Personal history of irradiation: Secondary | ICD-10-CM | POA: Diagnosis not present

## 2021-04-10 DIAGNOSIS — Z809 Family history of malignant neoplasm, unspecified: Secondary | ICD-10-CM | POA: Insufficient documentation

## 2021-04-10 NOTE — Progress Notes (Signed)
[place] Psychosocial Distress Screening Spiritual Care  Met with Finnis, his wife Tye [place], and daughter Elmyra Ricks in [facility] to introduce [facility] team/resources, reviewing distress screen per protocol.  The patient scored a 2 on the Psychosocial Distress Thermometer which indicates mild distress. Also assessed for distress and other psychosocial needs.   ONCBCN DISTRESS SCREENING 04/10/2021  Screening Type Initial Screening  Distress experienced in past week (1-10) 2  Emotional problem type Adjusting to illness  Referral to support programs Yes   Mr Vandeberg reports good family support. He and his wife travel frequently for recreation and to visit family. Provided pastoral presence, reflective listening, and [facility] team[facility]programming, including Prostate Cancer Support Group.  Follow up needed: No. Mr Galant prefers to reach out as needed/desired.   [place], [place], [facility] Pager [phone] Voicemail [phone]

## 2021-04-10 NOTE — Progress Notes (Signed)
Reason for the request:    Prostate cancer  HPI: I was asked by Dr. Alinda Money to evaluate Mr. Sommerfield for the evaluation of prostate cancer.  Is a 65 year old man was no significant comorbid conditions with a longstanding history of BPH and lower urinary tract symptoms that has been managed successfully with Cialis.  He was found to have an elevated PSA under the care of Dr. Alinda Money in February 2021 and a biopsy at that time in May of the same year showed a Gleason score of 3+3 equal 6 and limited cancer at that time.  A repeat PSA and January 2022 was 4.34 and a biopsy repeated in June 2021 showed a Gleason score 3+4 equal 7 and a 20% core.  Clinically, he does report symptoms of urinary frequency urgency and occasional nocturia but overall symptoms are manageable.  He remains active continues to attempt activities of daily living.  He does not report any headaches, blurry vision, syncope or seizures. Does not report any fevers, chills or sweats.  Does not report any cough, wheezing or hemoptysis.  Does not report any chest pain, palpitation, orthopnea or leg edema.  Does not report any nausea, vomiting or abdominal pain.  Does not report any constipation or diarrhea.  Does not report any skeletal complaints.    Does not report frequency, urgency or hematuria.  Does not report any skin rashes or lesions. Does not report any heat or cold intolerance.  Does not report any lymphadenopathy or petechiae.  Does not report any anxiety or depression.  Remaining review of systems is negative.     Past Medical History:  Diagnosis Date   Cancer [facility])    prostate   COPD (chronic obstructive pulmonary disease) ([place])    Hyperlipidemia   :   Past Surgical History:  Procedure Laterality Date   BACK SURGERY  July 2009   L-4, L-5  :   Current Outpatient Medications:    albuterol (PROVENTIL) (2.5 MG/3ML) 0.083% nebulizer solution, Take 2.5 mg by nebulization every 6 (six) hours as needed for wheezing or  shortness of breath. (Patient not taking: Reported on 04/10/2021), Disp: , Rfl:    albuterol (VENTOLIN HFA) 108 (90 Base) MCG/ACT inhaler, Inhale 2 puffs into the lungs every 6 (six) hours as needed for wheezing or shortness of breath. (Patient not taking: Reported on 04/10/2021), Disp: , Rfl:    Ascorbic Acid (VITAMIN C) 1000 MG tablet, Take 1,000 mg by mouth 2 (two) times daily. (Patient not taking: Reported on 04/10/2021), Disp: , Rfl:    aspirin EC 81 MG tablet, Take 81 mg by mouth daily. (Patient not taking: Reported on 04/10/2021), Disp: , Rfl:    azelastine (ASTELIN) 0.1 % nasal spray, Place into both nostrils 2 (two) times daily. Use in each nostril as directed, Disp: , Rfl:    budesonide-formoterol (SYMBICORT) 80-4.5 MCG/ACT inhaler, Inhale 2 puffs into the lungs 2 (two) times daily. (Patient not taking: No sig reported), Disp: 1 Inhaler, Rfl: 11   diclofenac (VOLTAREN) 75 MG EC tablet, Take 75 mg by mouth 2 (two) times daily. (Patient not taking: Reported on 04/10/2021), Disp: , Rfl:    esomeprazole (NEXIUM) 40 MG capsule, Take 40 mg by mouth daily as needed. (Patient not taking: Reported on 04/10/2021), Disp: , Rfl:    ezetimibe (ZETIA) 10 MG tablet, Take 10 mg by mouth daily., Disp: , Rfl:    Fluticasone-Umeclidin-Vilant (TRELEGY ELLIPTA) 100-62.5-25 MCG/INH AEPB, Take 100 mcg by mouth daily., Disp: , Rfl:  hydrochlorothiazide (HYDRODIURIL) 25 MG tablet, Take 25 mg by mouth daily., Disp: , Rfl:    Misc Natural Products (PROSTATE HEALTH) CAPS, Take 1 capsule by mouth daily. (Patient not taking: Reported on 04/10/2021), Disp: , Rfl:    nystatin (MYCOSTATIN) 100000 UNIT/ML suspension, Take 5 mLs by mouth 4 (four) times daily. (Patient not taking: Reported on 04/10/2021), Disp: , Rfl:    pregabalin (LYRICA) 150 MG capsule, Take 150 mg by mouth 3 (three) times daily. (Patient not taking: Reported on 04/10/2021), Disp: , Rfl:    tadalafil (CIALIS) 5 MG tablet, Take 5 mg by mouth daily., Disp: , Rfl:     terbinafine (LAMISIL) 250 MG tablet, Take 250 mg by mouth daily. Gwendolyn Lima three months, Disp: , Rfl:    tiZANidine (ZANAFLEX) 4 MG tablet, Take 4 mg by mouth every 6 (six) hours as needed for muscle spasms. (Patient not taking: Reported on 04/10/2021), Disp: , Rfl:    traMADol (ULTRAM) 50 MG tablet, Take 50 mg by mouth every 6 (six) hours as needed. (Patient not taking: Reported on 04/10/2021), Disp: , Rfl:    UNABLE TO FIND, Med Name: core complex for cholesterol (Patient not taking: Reported on 04/10/2021), Disp: , Rfl:    UNABLE TO FIND, Med Name: lung, sinus and bronchial health caps otc daily (Patient not taking: No sig reported), Disp: , Rfl: :   Allergies  Allergen Reactions   Sporanos [Itraconazole] Shortness Of Breath        Levofloxacin     REACTION: severe rash   Penicillins     REACTION: unknown  :   Family History  Problem Relation Age of Onset   Cancer Mother        ? type   Heart disease Father    Allergies Mother   :   Social History   Socioeconomic History   Marital status: Married    Spouse name: Not on file   Number of children: Not on file   Years of education: Not on file   Highest education level: Not on file  Occupational History   Not on file  Tobacco Use   Smoking status: Former    Packs/day: 1.00    Years: 30.00    Pack years: 30.00    Types: Cigarettes    Quit date: 09/17/2007    Years since quitting: 13.5   Smokeless tobacco: Never  Substance and Sexual Activity   Alcohol use: Yes    Alcohol/week: 2.0 standard drinks    Types: 2 Standard drinks or equivalent per week   Drug use: Not on file   Sexual activity: Not on file  Other Topics Concern   Not on file  Social History Narrative   Not on file   Social Determinants of Health   Financial Resource Strain: Not on file  Food Insecurity: Not on file  Transportation Needs: Not on file  Physical Activity: Not on file  Stress: Not on file  Social Connections: Not on file  Intimate Partner  Violence: Not on file  :  Pertinent items are noted in HPI.  Exam: ECOG 0 General appearance: alert and cooperative appeared without distress. Head: atraumatic without any abnormalities. Eyes: conjunctivae/corneas clear. PERRL.  Sclera anicteric. Throat: lips, mucosa, and tongue normal; without oral thrush or ulcers. Resp: clear to auscultation bilaterally without rhonchi, wheezes or dullness to percussion. Cardio: regular rate and rhythm, S1, S2 normal, no murmur, click, rub or gallop GI: soft, non-tender; bowel sounds normal; no masses,  no  organomegaly Skin: Skin color, texture, turgor normal. No rashes or lesions Lymph nodes: Cervical, supraclavicular, and axillary nodes normal. Neurologic: Grossly normal without any motor, sensory or deep tendon reflexes. Musculoskeletal: No joint deformity or effusion.   Assessment and Plan:    65 year old man with prostate cancer diagnosed in June 2021 with a Gleason score 3+3 equal 6 and a PSA of 6.23.  Repeat biopsy in June 2022 showed a Gleason score 3 + 4 = 7.  His case was discussed today in the prostate cancer multidisciplinary clinic including review of his pathology results, reviewing pathologist and discussing treatment choices.  These treatment options were discussed today with the patient and his family.  Primary surgical therapy versus radiation therapy were reviewed at this time.  The role for active surveillance if he against definitive treatment was also reviewed.  He understands the risk of developing metastatic disease is low as long as he adheres to the active surveillance protocol if he opts against treatment.  The role for systemic therapy options will be deferred unless he has metastatic disease in the future and also understands that these treatments are effective but they are not.  If.  He will consider all these options and make a decision in near future regarding treatment choices.   30  minutes were dedicated to this  visit. The time was spent on reviewing laboratory data, discussing treatment options, discussing complications related to cancer and cancer therapy.       A copy of this consult has been forwarded to the requesting physician.

## 2021-04-10 NOTE — Consult Note (Signed)
[facility]     04/10/2021   --------------------------------------------------------------------------------   Charmayne Sheer  MRN: [id number]  DOB: 08-23-1956, 65 year old Male  SSN: [id number]   PRIMARY CARE:  Lorenza Evangelist, [place]  REFERRING:  Raynelle Bring, Eduardo Osier  PROVIDER:  Raynelle Bring, M.D.  LOCATION:  [facility] [phone]     --------------------------------------------------------------------------------   CC/HPI: Prostate cancer   Location of consult: [facility] - Prostate Cancer Multidisciplinary [facility]   Mr. Adam Hess is a 65 year old gentleman with a PMH significant for mild COPD, GERD, hyperlipidemia, and anxiety. He was noted to have a rising PSA that increased to 6.32 in 2021. 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 After reviewing options, he elected active surveillance management. An MRI of the prostate was performed in January 2022 and demonstrated right apical and right mid PI-RADS 3 lesions. He eventually underwent an MR/US fusion biopsy on 03/12/21 that indicated upgraded Gleason 3+4=7 adenocarcinoma with 2 out of 20 biopsies positive for malignancy. His most recent PSA was 4.34.   PMH: He has a history of anxiety, COPD, GERD, and hyperlipidemia.  PSH: No abdominal surgeries.   Initial diagnosis: May 2021  TNM stage: cT1c Nx Mx  PSA: 6.23  Gleason score: 3+3=6 (Grade group 1)  Biopsy (01/19/20): 1/12 cores positive  Left: Benign  Right: Right lateral mid (5%)  Prostate volume: 50.4 cc  PSAD: 0.12   Surveillance:  Jan 2022: MRI - 1.3 cm PI-RADS 3 lesion at right apex, 1.3 cm PI-RADS 3 lesion at right mid, 1.4 cm left acetabular lesion probably a bone island  Jun 2022: MR/US fusion biopsy - R apical targeted biopsy (10%, 3+4-7), R mid (20%, 3+4=7), Vol 59.7 cc   Nomogram  OC disease: 81%  EPE: 17%  SVI: 1%  LNI: 1%  PFS  (5 year, 10 year): 91%, 84%    Baseline urinary function: IPSS is 13. He does take tadalafil 5 mg daily. This is been helpful form. He still has some bothersome symptoms include nocturia and frequency and hesitancy.  Baseline erectile function: SHIM score is 19. He is adequately treated with tadalafil 5 mg daily.     ALLERGIES: Levaquin TABS Penicillins    MEDICATIONS: Diazepam 10 mg tablet Take 10 mg 30-60 minutes prior to your procedure  Nexium PRN  Tadalafil 5 mg tablet 1 tablet PO Daily  Albuterol Sulfate     GU PSH: Prostate Needle Biopsy - 03/12/2021, 01/19/2020       PSH Notes: Back Surgery   NON-GU PSH: Surgical Pathology, Gross And Microscopic Examination For Prostate Needle - 03/12/2021, 01/19/2020     GU PMH: Prostate Cancer - 03/12/2021, - 10/13/2020, - 02/01/2020 BPH w/LUTS - 10/13/2020, Benign prostatic hyperplasia (BPH) with straining on urination, - 2016 ED due to arterial insufficiency - 10/13/2020, Erectile dysfunction due to arterial insufficiency, - 2016 Nocturia - 10/13/2020, - 2018 Elevated PSA, Elevated prostate specific antigen (PSA) - 2016 Renal calculus, Nephrolithiasis - 2014      PMH Notes:   1) Prostate cancer: He was noted to have a rising PSA that increased to 6.32. 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 After reviewing options, he elected active surveillance management.   PMH: He has a history of anxiety, COPD, GERD, and hyperlipidemia.  PSH: No abdominal surgeries.  Initial diagnosis: May 2021  TNM stage: cT1c Nx Mx  PSA: 6.23  Gleason score: 3+3=6 (Grade group 1)  Biopsy (01/19/20): 1/12 cores positive  Left: Benign  Right: Right lateral mid (5%)  Prostate volume: 50.4 cc  PSAD: 0.12   Surveillance:  Jan 2022: MRI - 1.3 cm PI-RADS 3 lesion at right apex, 1.3 cm PI-RADS 3 lesion at right mid, 1.4 cm left acetabular lesion probably a bone island  Jun 2022:  MR/US fusion biopsy - R apical targeted biopsy (10%, 3+4-7), R mid (20%, 3+4=7), Vol 59.7 cc   Baseline urinary function: IPSS is 13. He does take tadalafil 5 mg daily. This is been helpful form. He still has some bothersome symptoms include nocturia and frequency and hesitancy.  Baseline erectile function: SHIM score is 19. He is adequately treated with tadalafil 5 mg daily.    2) Erectile dysfunction: He does have erectile dysfunction which has been adequately treated with Cialis. He has tried the Cialis 20 mg and Cialis 5 mg and has found 20 mg on demand therapy to be most successful. Baseline SHIM score is 15. He does have a history of a traumatic back injury and prior back surgery which may be a contributing factor.   Current treatment: Cialis 5 mg daily   3) BPH/LUTS: His baseline symptoms include a sense of incomplete emptying, intermittency, weak stream, and nocturia as well as postvoid dribbling. Post void dribbling has been his most bothersome symptom. IPSS at baseline is 19.   Current treatment: Cialis 5 mg     NON-GU PMH: Anxiety, Anxiety (Symptom) - 2014 COPD, Chronic Obstructive Pulmonary Disease - 2014 Congenital hiatus hernia GERD Hypercholesterolemia    FAMILY HISTORY: Acute Myocardial Infarction - Runs In Family cardiac disorder - Runs In Family Diabetes - Runs In Family Family Health Status Children _4__ Living Daughter - Runs In Family Myocardial Infarction - Runs In Family nephrolithiasis - Runs In Family No pertinent family history - Runs In Family Prostate Cancer - Runs In Family Septicemia - Runs In Family   SOCIAL HISTORY: Marital Status: Single Preferred Language: English; Ethnicity: Not Hispanic Or Latino; Race: White Current Smoking Status: Patient has never smoked.   Tobacco Use Assessment Completed: Used Tobacco in last 30 days? Does not drink anymore.  Drinks 1 caffeinated drink per day.     Notes: Never A Smoker, Marital History - Single,  Alcohol Use   REVIEW OF SYSTEMS:    GU Review Male:   Patient denies frequent urination, hard to postpone urination, burning/ pain with urination, get up at night to urinate, leakage of urine, stream starts and stops, trouble starting your streams, and have to strain to urinate .  Gastrointestinal (Upper):   Patient denies nausea and vomiting.  Gastrointestinal (Lower):   Patient denies diarrhea and constipation.  Constitutional:   Patient denies fever, night sweats, weight loss, and fatigue.  Skin:   Patient denies skin rash/ lesion and itching.  Eyes:   Patient denies blurred vision and double vision.  Ears/ Nose/ Throat:   Patient denies sore throat and sinus problems.  Hematologic/Lymphatic:   Patient denies swollen glands and easy bruising.  Cardiovascular:   Patient denies leg swelling and chest pains.  Respiratory:   Patient denies cough and shortness of breath.  Endocrine:   Patient denies excessive thirst.  Musculoskeletal:   Patient denies back pain and joint pain.  Neurological:   Patient denies headaches and dizziness.  Psychologic:   Patient denies depression and anxiety.  VITAL SIGNS: None   MULTI-SYSTEM PHYSICAL EXAMINATION:    Constitutional: Well-nourished. No physical deformities. Normally developed. Good grooming.     Complexity of Data:  Lab Test Review:   PSA  Records Review:   Pathology Reports, Previous Patient Records  X-Ray Review: MRI Prostate GSORAD: Reviewed Films.     10/06/20 10/27/19 12/12/17 06/11/17 12/26/16 06/03/15 05/13/14 04/30/13  PSA  Total PSA 4.34 ng/mL 6.23 ng/mL 3.92 ng/mL 3.30 ng/mL 2.34 ng/dl 2.37  1.95  2.52   Free PSA  1.07 ng/mL        % Free PSA  17 % PSA          PROCEDURES: None   ASSESSMENT:      ICD-10 Details  1 GU:   Prostate Cancer - C61   2   BPH w/LUTS - N40.1   3   Nocturia - R35.1   4   ED due to arterial insufficiency - N52.01    PLAN:           Document Letter(s):  Created for Patient: Clinical Summary          Notes:   1. Favorable intermediate risk prostate cancer: I had a detailed discussion with Laurey Arrow, his wife, and his daughter today regarding his recent prostate biopsy results. He has been seen by both Dr. Tammi Klippel and Dr. Alen Blew this afternoon already. The patient was counseled about the natural history of prostate cancer and the standard treatment options that are available for prostate cancer. It was explained to him how his age and life expectancy, clinical stage, Gleason score, and PSA affect his prognosis, the decision to proceed with additional staging studies, as well as how that information influences recommended treatment strategies. We discussed the roles for active surveillance, radiation therapy, surgical therapy, androgen deprivation, as well as ablative therapy options for the treatment of prostate cancer as appropriate to his individual cancer situation. We discussed the risks and benefits of these options with regard to their impact on cancer control and also in terms of potential adverse events, complications, and impact on quality of life particularly related to urinary and sexual function. The patient was encouraged to ask questions throughout the discussion today and all questions were answered to his stated satisfaction. In addition, the patient was provided with and/or directed to appropriate resources and literature for further education about prostate cancer and treatment options.   Reviewing his situation understanding the Laurey Arrow does place a high priority on his quality of life, we reviewed all options today including therapies of curative intent as well as ongoing active surveillance. Although he does technically have favorable intermediate risk disease and age 14, he has only 5% of pattern 4 disease in each of his 2 biopsy cores. Furthermore, his targeted MRI lesions were negative. After reviewing options, he does wish to continue with active surveillance and he does except the  high risk that comes along with this now that he does have favorable intermediate risk disease. He is still going to potentially consider his options and may change his mind but he does not want to make a rash decision which seems to be quite appropriate in this situation. He is well informed and we have reviewed the potential risks of treatment with either surgery or radiation. Ultimately, we have agreed to proceed with a repeat PSA test over the next month and to submit his biopsy pathology for Oncotype DX testing. We will then plan to schedule a follow-up discussion and repeat his  digital rectal exam at that time. He will then make a final decision as to how he would like to proceed based on all of that information.   Cc: Lorenza Evangelist, PA-C  Dr. Zola Button  Dr. Tyler Pita    E & M CODES: We spent 38 minutes dedicated to evaluation and management time, including face to face interaction, discussions on coordination of care, documentation, result review, and discussion with others as applicable.

## 2021-04-10 NOTE — Progress Notes (Signed)
                               Care Plan Summary  Name: Mr. Jeremie Steenberg DOB: [birth year]   Your Medical Team:   Urologist -  Dr. Raynelle Bring, [facility]  Radiation Oncologist - Dr. Tyler Pita, [facility]   Medical Oncologist - Dr. Zola Button, [place]  Recommendations: 1) Continue  close active surveillance    * These recommendations are based on information available as of today's consult.      Recommendations may change depending on the results of further tests or exams.  Next Steps: 1) Follow up with Dr. Alinda Money as scheduled.    When appointments need to be scheduled, you will be contacted by [facility] and/or [facility].  Questions?  Please do not hesitate to call Cira Rue, RN, BSN, OCN at [phone] any questions or concerns.  Shirlean Mylar is your Oncology Nurse Navigator and is available to assist you while you're receiving your medical care at [facility].

## 2021-04-10 NOTE — Progress Notes (Signed)
Radiation Oncology         [phone] ________________________________  Multidisciplinary Prostate Cancer Clinic  Initial Radiation Oncology Consultation  Name: Adam Hess MRN: [id number]  Date: 04/10/2021  DOB: 09/29/1955  [id number], Hector Brunswick, MD   REFERRING PHYSICIAN: Raynelle Bring, MD  DIAGNOSIS: 65 y.o. gentleman with stage T1c adenocarcinoma of the prostate with a Gleason's score of 3+4 and a PSA of 4.34    ICD-10-CM   1. Malignant neoplasm of prostate ([place])  [place] ILLNESS::Adam Hess is a 65 y.o. gentleman. He has been followed by Dr. Alinda Money for a number of years for BPH with LUTS, ED, and rising PSA but was hesitant to proceed with prostate biopsy when seen in 2019.  He did not follow-up again until 11/03/2019 when his PSA was further elevated at 6.23, up from 3.92 in March 2019.  Digital rectal exam remained without any concerning findings. He proceeded to prostate biopsy on 01/19/20 showing a single core of Gleason 3+3 prostate cancer in 5% of the tissue. He appropriately opted for active surveillance.    He underwent surveillance prostate MRI on 09/28/20 showing two small, 1.3 cm PI-RADS 3 lesions present in the peripheral zone at the right apex and right mid gland.  The prostate volume was estimated to be 58.32 cc.  Additionally, there was a 1.4 cm low signal lesion in the anterior wall of the left acetabulum without associated enhancement or surrounding edema, felt most likely to represent a bone island.  A repeat PSA on 10/08/2020 was 4.34.    The patient proceeded to MRI fusion biopsy on 03/12/21.  The prostate volume measured 59.7 cc by ultrasound.  Out of 18 core biopsies, 2 were positive.  The maximum Gleason score was 3+4 (5% pattern 4), and this was seen in one sample from ROI MRI lesion #1 and in the right mid lateral core.    The patient reviewed the biopsy results with his urologist and he has kindly been  referred today to the multidisciplinary prostate cancer clinic for presentation of pathology and radiology studies in our conference for discussion of potential radiation treatment options and clinical evaluation.   PREVIOUS RADIATION THERAPY: No  PAST MEDICAL HISTORY:  has a past medical history of Cancer ([place]), COPD (chronic obstructive pulmonary disease) ([place]), and Hyperlipidemia.    PAST SURGICAL HISTORY: Past Surgical History:  Procedure Laterality Date   BACK SURGERY  July 2009   L-4, L-5    FAMILY HISTORY: family history includes Allergies in his mother; Cancer in his mother; Heart disease in his father.  SOCIAL HISTORY:  reports that he quit smoking about 13 years ago. His smoking use included cigarettes. He has a 30.00 pack-year smoking history. He has never used smokeless tobacco. He reports current alcohol use of about 2.0 standard drinks of alcohol per week.  ALLERGIES: Sporanos [itraconazole], Levofloxacin, and Penicillins  MEDICATIONS:  Current Outpatient Medications  Medication Sig Dispense Refill   albuterol (PROVENTIL) (2.5 MG/3ML) 0.083% nebulizer solution Take 2.5 mg by nebulization every 6 (six) hours as needed for wheezing or shortness of breath.     albuterol (VENTOLIN HFA) 108 (90 Base) MCG/ACT inhaler Inhale 2 puffs into the lungs every 6 (six) hours as needed for wheezing or shortness of breath.     Ascorbic Acid (VITAMIN C) 1000 MG tablet Take 1,000 mg by mouth 2 (two) times daily.     aspirin EC 81 MG tablet  Take 81 mg by mouth daily.     budesonide-formoterol (SYMBICORT) 80-4.5 MCG/ACT inhaler Inhale 2 puffs into the lungs 2 (two) times daily. (Patient not taking: Reported on 01/12/2018) 1 Inhaler 11   diclofenac (VOLTAREN) 75 MG EC tablet Take 75 mg by mouth 2 (two) times daily.     esomeprazole (NEXIUM) 40 MG capsule Take 40 mg by mouth daily as needed.     Misc Natural Products (PROSTATE HEALTH) CAPS Take 1 capsule by mouth daily.     nystatin (MYCOSTATIN)  100000 UNIT/ML suspension Take 5 mLs by mouth 4 (four) times daily.     pregabalin (LYRICA) 150 MG capsule Take 150 mg by mouth 3 (three) times daily.     tadalafil (CIALIS) 5 MG tablet Take 5 mg by mouth daily.     tiZANidine (ZANAFLEX) 4 MG tablet Take 4 mg by mouth every 6 (six) hours as needed for muscle spasms.     traMADol (ULTRAM) 50 MG tablet Take 50 mg by mouth every 6 (six) hours as needed.     UNABLE TO FIND Med Name: core complex for cholesterol     UNABLE TO FIND Med Name: lung, sinus and bronchial health caps otc daily     No current facility-administered medications for this encounter.    REVIEW OF SYSTEMS:  On review of systems, the patient reports that he is doing well overall. He denies any chest pain, shortness of breath, cough, fevers, chills, night sweats, unintended weight changes. He denies any bowel disturbances, and denies abdominal pain, nausea or vomiting. He denies any new musculoskeletal or joint aches or pains. His IPSS was 16, indicating moderate urinary symptoms. His SHIM was 5, indicating he has severe erectile dysfunction. A complete review of systems is obtained and is otherwise negative.   PHYSICAL EXAM:  Wt Readings from Last 3 Encounters:  04/10/21 250 lb 6.4 oz (113.6 kg)  01/02/21 233 lb 11 oz (106 kg)  01/12/18 234 lb (106.1 kg)   Temp Readings from Last 3 Encounters:  04/10/21 97.8 F (36.6 C)  01/02/21 98.4 F (36.9 C) (Oral)  10/08/11 98 F (36.7 C) (Oral)   BP Readings from Last 3 Encounters:  04/10/21 129/76  01/02/21 140/85  01/12/18 130/74   Pulse Readings from Last 3 Encounters:  04/10/21 81  01/02/21 70  01/12/18 83    /10  In general this is a well appearing Caucasian male in no acute distress.  He's alert and oriented x4 and appropriate throughout the examination. Cardiopulmonary assessment is negative for acute distress and he exhibits normal effort.    KPS = 100  100 - Normal; no complaints; no evidence of disease. 90    - Able to carry on normal activity; minor signs or symptoms of disease. 80   - Normal activity with effort; some signs or symptoms of disease. 80   - Cares for self; unable to carry on normal activity or to do active work. 60   - Requires occasional assistance, but is able to care for most of his personal needs. 50   - Requires considerable assistance and frequent medical care. 47   - Disabled; requires special care and assistance. 26   - Severely disabled; hospital admission is indicated although death not imminent. 59   - Very sick; hospital admission necessary; active supportive treatment necessary. 10   - Moribund; fatal processes progressing rapidly. 0     - Dead  Karnofsky DA, Abelmann Eunice, Craver LS and Burchenal [facility] (  1948) The use of the nitrogen mustards in the palliative treatment of carcinoma: with particular reference to bronchogenic carcinoma Cancer 1 634-56   LABORATORY DATA:  Lab Results  Component Value Date   WBC 6.1 01/12/2018   HGB 14.9 01/12/2018   HCT 44.0 01/12/2018   MCV 89.5 01/12/2018   PLT 394.0 01/12/2018   Lab Results  Component Value Date   NA 137 05/27/2011   K 4.1 05/27/2011   CL 102 05/27/2011   CO2 28 05/27/2011   Lab Results  Component Value Date   ALT 29 05/27/2011   AST 25 05/27/2011   ALKPHOS 63 05/27/2011   BILITOT 0.2 (L) 05/27/2011     RADIOGRAPHY: No results found.    IMPRESSION/PLAN: 65 y.o. gentleman with Stage T1c adenocarcinoma of the prostate with a Gleason score of 3+4 and a PSA of 4.34.    We discussed the patient's workup and outlined the nature of prostate cancer in this setting. The patient's T stage, Gleason's score, and PSA put him into the favorable intermediate risk group. Accordingly, he is eligible for a variety of potential treatment options including continued active surveillance, brachytherapy, 5.5 weeks of external radiation, or prostatectomy. We discussed the available radiation techniques, and focused on the  details and logistics of delivery. We discussed and outlined the risks, benefits, short and long-term effects associated with radiotherapy and compared and contrasted these with prostatectomy. We discussed the role of SpaceOAR gel in reducing the rectal toxicity associated with radiotherapy. He appears to have a good understanding of his disease and our treatment recommendations which are of curative intent.  He was encouraged to ask questions that were answered to his stated satisfaction.  At the end of the conversation the patient is undecided but appears to be leaning towards continuing in active surveillance. He expressed interest in having an Oncotype DX test performed to ensure there is no evidence for any higher grade disease. We will share our discussion with Dr. Alinda Money and look forward to following along in the care of this very nice gentleman.  Of course, we would be more than happy to continue to participate in his care if clinically indicated in the future.     Nicholos Johns, PA-C    Tyler Pita, MD  [facility] Direct Dial: [phone]  Fax: [phone] [email or web address]  Skype  LinkedIn   This document serves as a record of services personally performed by Tyler Pita, MD and Freeman Caldron, PA-C. It was created on their behalf by Wilburn Mylar, a trained medical scribe. The creation of this record is based on the scribe's personal observations and the provider's statements to them. This document has been checked and approved by the attending provider.

## 2021-06-12 DIAGNOSIS — M961 Postlaminectomy syndrome, not elsewhere classified: Secondary | ICD-10-CM | POA: Diagnosis not present

## 2021-06-12 DIAGNOSIS — I10 Essential (primary) hypertension: Secondary | ICD-10-CM | POA: Diagnosis not present

## 2021-06-12 DIAGNOSIS — M7062 Trochanteric bursitis, left hip: Secondary | ICD-10-CM | POA: Diagnosis not present

## 2021-06-12 DIAGNOSIS — Z6831 Body mass index (BMI) 31.0-31.9, adult: Secondary | ICD-10-CM | POA: Diagnosis not present

## 2021-06-12 DIAGNOSIS — M7918 Myalgia, other site: Secondary | ICD-10-CM | POA: Diagnosis not present

## 2021-07-18 DIAGNOSIS — Z79899 Other long term (current) drug therapy: Secondary | ICD-10-CM | POA: Diagnosis not present

## 2021-07-18 DIAGNOSIS — E782 Mixed hyperlipidemia: Secondary | ICD-10-CM | POA: Diagnosis not present

## 2021-07-18 DIAGNOSIS — J449 Chronic obstructive pulmonary disease, unspecified: Secondary | ICD-10-CM | POA: Diagnosis not present

## 2021-07-18 DIAGNOSIS — I10 Essential (primary) hypertension: Secondary | ICD-10-CM | POA: Diagnosis not present

## 2021-07-24 DIAGNOSIS — R799 Abnormal finding of blood chemistry, unspecified: Secondary | ICD-10-CM | POA: Diagnosis not present

## 2021-08-01 DIAGNOSIS — M7061 Trochanteric bursitis, right hip: Secondary | ICD-10-CM | POA: Diagnosis not present

## 2021-08-01 DIAGNOSIS — M961 Postlaminectomy syndrome, not elsewhere classified: Secondary | ICD-10-CM | POA: Diagnosis not present

## 2021-08-01 DIAGNOSIS — M7918 Myalgia, other site: Secondary | ICD-10-CM | POA: Diagnosis not present

## 2021-08-11 DIAGNOSIS — J0100 Acute maxillary sinusitis, unspecified: Secondary | ICD-10-CM | POA: Diagnosis not present

## 2021-08-16 DIAGNOSIS — Z87891 Personal history of nicotine dependence: Secondary | ICD-10-CM | POA: Diagnosis not present

## 2021-08-16 DIAGNOSIS — J4541 Moderate persistent asthma with (acute) exacerbation: Secondary | ICD-10-CM | POA: Diagnosis not present

## 2021-08-16 DIAGNOSIS — H1013 Acute atopic conjunctivitis, bilateral: Secondary | ICD-10-CM | POA: Diagnosis not present

## 2021-08-16 DIAGNOSIS — B349 Viral infection, unspecified: Secondary | ICD-10-CM | POA: Diagnosis not present

## 2021-08-16 DIAGNOSIS — J3089 Other allergic rhinitis: Secondary | ICD-10-CM | POA: Diagnosis not present

## 2021-11-28 DIAGNOSIS — M6281 Muscle weakness (generalized): Secondary | ICD-10-CM | POA: Diagnosis not present

## 2021-11-28 DIAGNOSIS — R293 Abnormal posture: Secondary | ICD-10-CM | POA: Diagnosis not present

## 2021-11-28 DIAGNOSIS — M2569 Stiffness of other specified joint, not elsewhere classified: Secondary | ICD-10-CM | POA: Diagnosis not present

## 2021-11-28 DIAGNOSIS — M545 Low back pain, unspecified: Secondary | ICD-10-CM | POA: Diagnosis not present

## 2021-12-09 DIAGNOSIS — J029 Acute pharyngitis, unspecified: Secondary | ICD-10-CM | POA: Diagnosis not present

## 2021-12-13 DIAGNOSIS — L509 Urticaria, unspecified: Secondary | ICD-10-CM | POA: Diagnosis not present

## 2021-12-13 DIAGNOSIS — J4541 Moderate persistent asthma with (acute) exacerbation: Secondary | ICD-10-CM | POA: Diagnosis not present

## 2021-12-13 DIAGNOSIS — J3089 Other allergic rhinitis: Secondary | ICD-10-CM | POA: Diagnosis not present

## 2021-12-13 DIAGNOSIS — B09 Unspecified viral infection characterized by skin and mucous membrane lesions: Secondary | ICD-10-CM | POA: Diagnosis not present

## 2021-12-13 DIAGNOSIS — D692 Other nonthrombocytopenic purpura: Secondary | ICD-10-CM | POA: Diagnosis not present

## 2021-12-13 DIAGNOSIS — L573 Poikiloderma of Civatte: Secondary | ICD-10-CM | POA: Diagnosis not present

## 2021-12-13 DIAGNOSIS — H1013 Acute atopic conjunctivitis, bilateral: Secondary | ICD-10-CM | POA: Diagnosis not present

## 2021-12-31 DIAGNOSIS — J3089 Other allergic rhinitis: Secondary | ICD-10-CM | POA: Diagnosis not present

## 2021-12-31 DIAGNOSIS — H1013 Acute atopic conjunctivitis, bilateral: Secondary | ICD-10-CM | POA: Diagnosis not present

## 2021-12-31 DIAGNOSIS — J4540 Moderate persistent asthma, uncomplicated: Secondary | ICD-10-CM | POA: Diagnosis not present

## 2022-01-15 DIAGNOSIS — D692 Other nonthrombocytopenic purpura: Secondary | ICD-10-CM | POA: Diagnosis not present

## 2022-01-15 DIAGNOSIS — L501 Idiopathic urticaria: Secondary | ICD-10-CM | POA: Diagnosis not present

## 2022-02-13 DIAGNOSIS — R351 Nocturia: Secondary | ICD-10-CM | POA: Diagnosis not present

## 2022-02-13 DIAGNOSIS — N5201 Erectile dysfunction due to arterial insufficiency: Secondary | ICD-10-CM | POA: Diagnosis not present

## 2022-02-13 DIAGNOSIS — C61 Malignant neoplasm of prostate: Secondary | ICD-10-CM | POA: Diagnosis not present

## 2022-02-13 DIAGNOSIS — N401 Enlarged prostate with lower urinary tract symptoms: Secondary | ICD-10-CM | POA: Diagnosis not present

## 2022-03-14 DIAGNOSIS — M26623 Arthralgia of bilateral temporomandibular joint: Secondary | ICD-10-CM | POA: Diagnosis not present

## 2022-03-14 DIAGNOSIS — D692 Other nonthrombocytopenic purpura: Secondary | ICD-10-CM | POA: Diagnosis not present

## 2022-03-22 DIAGNOSIS — M19042 Primary osteoarthritis, left hand: Secondary | ICD-10-CM | POA: Diagnosis not present

## 2022-04-09 DIAGNOSIS — M545 Low back pain, unspecified: Secondary | ICD-10-CM | POA: Diagnosis not present

## 2022-04-09 DIAGNOSIS — M25562 Pain in left knee: Secondary | ICD-10-CM | POA: Diagnosis not present

## 2022-04-09 DIAGNOSIS — D692 Other nonthrombocytopenic purpura: Secondary | ICD-10-CM | POA: Diagnosis not present

## 2022-04-09 DIAGNOSIS — R600 Localized edema: Secondary | ICD-10-CM | POA: Diagnosis not present

## 2022-04-09 DIAGNOSIS — M79606 Pain in leg, unspecified: Secondary | ICD-10-CM | POA: Diagnosis not present

## 2022-04-09 DIAGNOSIS — M26623 Arthralgia of bilateral temporomandibular joint: Secondary | ICD-10-CM | POA: Diagnosis not present

## 2022-04-09 DIAGNOSIS — R7301 Impaired fasting glucose: Secondary | ICD-10-CM | POA: Diagnosis not present

## 2022-04-15 ENCOUNTER — Other Ambulatory Visit: Payer: Self-pay | Admitting: Surgery

## 2022-04-15 ENCOUNTER — Other Ambulatory Visit: Payer: Self-pay | Admitting: Allergy and Immunology

## 2022-04-15 DIAGNOSIS — M25562 Pain in left knee: Secondary | ICD-10-CM

## 2022-04-16 ENCOUNTER — Other Ambulatory Visit: Payer: Self-pay | Admitting: Physician Assistant

## 2022-04-16 DIAGNOSIS — M25562 Pain in left knee: Secondary | ICD-10-CM

## 2022-04-16 DIAGNOSIS — R600 Localized edema: Secondary | ICD-10-CM

## 2022-04-23 ENCOUNTER — Ambulatory Visit
Admission: RE | Admit: 2022-04-23 | Discharge: 2022-04-23 | Disposition: A | Discharge status: Home / Self Care | Payer: Medicare HMO | Source: Ambulatory Visit | Attending: Physician Assistant | Admitting: Physician Assistant

## 2022-04-23 ENCOUNTER — Ambulatory Visit
Admission: RE | Admit: 2022-04-23 | Discharge: 2022-04-23 | Disposition: A | Discharge status: Home / Self Care | Payer: Medicare HMO | Source: Ambulatory Visit | Attending: Surgery | Admitting: Surgery

## 2022-04-23 DIAGNOSIS — M25562 Pain in left knee: Secondary | ICD-10-CM | POA: Diagnosis not present

## 2022-04-23 DIAGNOSIS — R600 Localized edema: Secondary | ICD-10-CM | POA: Diagnosis not present

## 2022-05-02 DIAGNOSIS — M25562 Pain in left knee: Secondary | ICD-10-CM | POA: Diagnosis not present

## 2022-05-02 DIAGNOSIS — M1712 Unilateral primary osteoarthritis, left knee: Secondary | ICD-10-CM | POA: Diagnosis not present

## 2022-06-27 DIAGNOSIS — M79642 Pain in left hand: Secondary | ICD-10-CM | POA: Diagnosis not present

## 2022-06-27 DIAGNOSIS — M79641 Pain in right hand: Secondary | ICD-10-CM | POA: Diagnosis not present

## 2022-06-28 DIAGNOSIS — R609 Edema, unspecified: Secondary | ICD-10-CM | POA: Diagnosis not present

## 2022-06-28 DIAGNOSIS — M19042 Primary osteoarthritis, left hand: Secondary | ICD-10-CM | POA: Diagnosis not present

## 2022-06-28 DIAGNOSIS — M1712 Unilateral primary osteoarthritis, left knee: Secondary | ICD-10-CM | POA: Diagnosis not present

## 2022-07-03 DIAGNOSIS — H524 Presbyopia: Secondary | ICD-10-CM | POA: Diagnosis not present

## 2022-07-29 DIAGNOSIS — J209 Acute bronchitis, unspecified: Secondary | ICD-10-CM | POA: Diagnosis not present

## 2022-07-29 DIAGNOSIS — J449 Chronic obstructive pulmonary disease, unspecified: Secondary | ICD-10-CM | POA: Diagnosis not present

## 2022-10-02 DIAGNOSIS — H0014 Chalazion left upper eyelid: Secondary | ICD-10-CM | POA: Diagnosis not present

## 2022-10-24 DIAGNOSIS — T33012A Superficial frostbite of left ear, initial encounter: Secondary | ICD-10-CM | POA: Diagnosis not present

## 2022-11-12 DIAGNOSIS — M5441 Lumbago with sciatica, right side: Secondary | ICD-10-CM | POA: Diagnosis not present

## 2022-11-12 DIAGNOSIS — M25551 Pain in right hip: Secondary | ICD-10-CM | POA: Diagnosis not present

## 2022-11-25 DIAGNOSIS — M2569 Stiffness of other specified joint, not elsewhere classified: Secondary | ICD-10-CM | POA: Diagnosis not present

## 2022-11-25 DIAGNOSIS — M545 Low back pain, unspecified: Secondary | ICD-10-CM | POA: Diagnosis not present

## 2022-11-25 DIAGNOSIS — M6281 Muscle weakness (generalized): Secondary | ICD-10-CM | POA: Diagnosis not present

## 2022-11-25 DIAGNOSIS — G5701 Lesion of sciatic nerve, right lower limb: Secondary | ICD-10-CM | POA: Diagnosis not present

## 2022-11-26 DIAGNOSIS — M5441 Lumbago with sciatica, right side: Secondary | ICD-10-CM | POA: Diagnosis not present

## 2022-11-27 ENCOUNTER — Other Ambulatory Visit: Payer: Self-pay | Admitting: Family Medicine

## 2022-11-27 DIAGNOSIS — M5451 Vertebrogenic low back pain: Secondary | ICD-10-CM

## 2022-11-27 DIAGNOSIS — M545 Low back pain, unspecified: Secondary | ICD-10-CM

## 2022-12-03 DIAGNOSIS — R351 Nocturia: Secondary | ICD-10-CM | POA: Diagnosis not present

## 2022-12-03 DIAGNOSIS — N401 Enlarged prostate with lower urinary tract symptoms: Secondary | ICD-10-CM | POA: Diagnosis not present

## 2022-12-03 DIAGNOSIS — M545 Low back pain, unspecified: Secondary | ICD-10-CM | POA: Diagnosis not present

## 2022-12-15 ENCOUNTER — Ambulatory Visit
Admission: RE | Admit: 2022-12-15 | Discharge: 2022-12-15 | Disposition: A | Discharge status: Home / Self Care | Payer: Medicare HMO | Source: Ambulatory Visit | Attending: Family Medicine | Admitting: Family Medicine

## 2022-12-15 DIAGNOSIS — M48061 Spinal stenosis, lumbar region without neurogenic claudication: Secondary | ICD-10-CM | POA: Diagnosis not present

## 2022-12-15 DIAGNOSIS — M545 Low back pain, unspecified: Secondary | ICD-10-CM

## 2022-12-15 DIAGNOSIS — M5451 Vertebrogenic low back pain: Secondary | ICD-10-CM

## 2022-12-15 DIAGNOSIS — M79604 Pain in right leg: Secondary | ICD-10-CM | POA: Diagnosis not present

## 2022-12-18 ENCOUNTER — Other Ambulatory Visit: Payer: Medicare HMO

## 2022-12-18 DIAGNOSIS — M5441 Lumbago with sciatica, right side: Secondary | ICD-10-CM | POA: Diagnosis not present

## 2022-12-18 DIAGNOSIS — M7061 Trochanteric bursitis, right hip: Secondary | ICD-10-CM | POA: Diagnosis not present

## 2023-01-10 DIAGNOSIS — J42 Unspecified chronic bronchitis: Secondary | ICD-10-CM | POA: Diagnosis not present

## 2023-01-10 DIAGNOSIS — J069 Acute upper respiratory infection, unspecified: Secondary | ICD-10-CM | POA: Diagnosis not present

## 2023-01-15 DIAGNOSIS — E785 Hyperlipidemia, unspecified: Secondary | ICD-10-CM | POA: Diagnosis not present

## 2023-01-15 DIAGNOSIS — J302 Other seasonal allergic rhinitis: Secondary | ICD-10-CM | POA: Diagnosis not present

## 2023-01-15 DIAGNOSIS — R030 Elevated blood-pressure reading, without diagnosis of hypertension: Secondary | ICD-10-CM | POA: Diagnosis not present

## 2023-01-15 DIAGNOSIS — D72829 Elevated white blood cell count, unspecified: Secondary | ICD-10-CM | POA: Diagnosis not present

## 2023-01-15 DIAGNOSIS — J019 Acute sinusitis, unspecified: Secondary | ICD-10-CM | POA: Diagnosis not present

## 2023-03-31 IMAGING — DX DG FINGER INDEX 2+V*L*
3 series · 3 of 3 positions shown · non-contrast
Comparison: None.

CLINICAL DATA: Pain and numbness of the left index finger. Injured
with a knife 1 week ago.

EXAM:
LEFT INDEX FINGER 2+V

[x finger pa left]
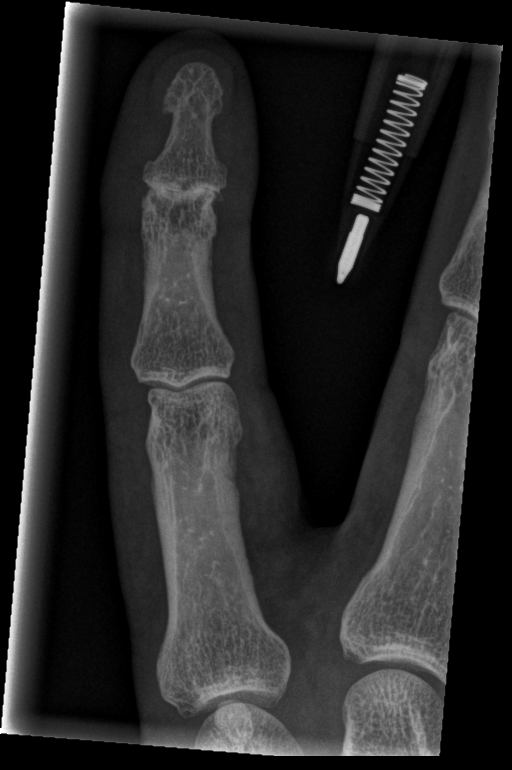

[x finger obl left]
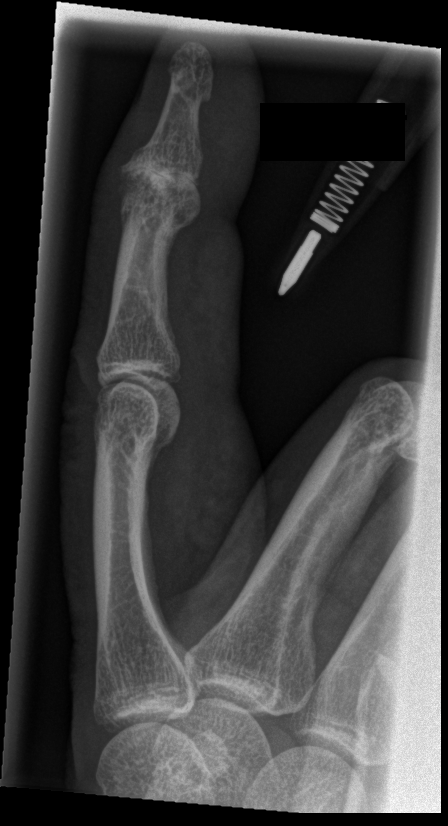

[x finger lat left]
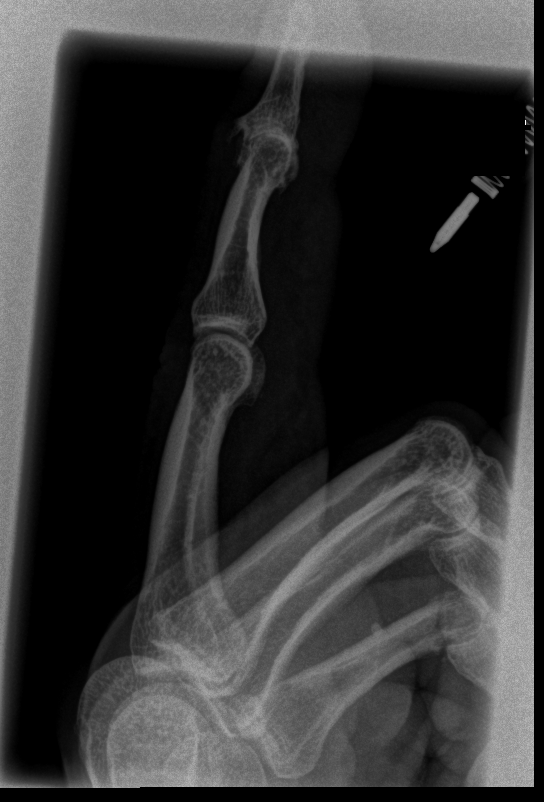

[3 of 3 positions shown; findings below may reference images not displayed]

FINDINGS: Advanced degenerative changes at the distal interphalangeal joint
with joint space narrowing and spurring. No obvious erosive changes.
The PIP joint and MCP joint are maintained.

No acute bony findings or destructive bony changes.

Diffuse soft tissue swelling is noted but no definite gas is seen in
the soft tissues and no radiopaque foreign body is identified.
IMPRESSION: 1. Advanced degenerative changes at the distal interphalangeal
joint.
2. No acute bony findings or radiopaque foreign body.
3. Diffuse soft tissue swelling.

## 2023-04-07 DIAGNOSIS — M79642 Pain in left hand: Secondary | ICD-10-CM | POA: Diagnosis not present

## 2023-04-07 DIAGNOSIS — M1712 Unilateral primary osteoarthritis, left knee: Secondary | ICD-10-CM | POA: Diagnosis not present

## 2023-04-07 DIAGNOSIS — M160 Bilateral primary osteoarthritis of hip: Secondary | ICD-10-CM | POA: Diagnosis not present

## 2023-04-07 DIAGNOSIS — M79641 Pain in right hand: Secondary | ICD-10-CM | POA: Diagnosis not present

## 2023-04-15 DIAGNOSIS — R29898 Other symptoms and signs involving the musculoskeletal system: Secondary | ICD-10-CM | POA: Diagnosis not present

## 2023-04-15 DIAGNOSIS — M7061 Trochanteric bursitis, right hip: Secondary | ICD-10-CM | POA: Diagnosis not present

## 2023-04-15 DIAGNOSIS — M7062 Trochanteric bursitis, left hip: Secondary | ICD-10-CM | POA: Diagnosis not present

## 2023-04-15 DIAGNOSIS — M1712 Unilateral primary osteoarthritis, left knee: Secondary | ICD-10-CM | POA: Diagnosis not present

## 2023-04-15 DIAGNOSIS — M25652 Stiffness of left hip, not elsewhere classified: Secondary | ICD-10-CM | POA: Diagnosis not present

## 2023-04-15 DIAGNOSIS — Z789 Other specified health status: Secondary | ICD-10-CM | POA: Diagnosis not present

## 2023-04-15 DIAGNOSIS — Z7409 Other reduced mobility: Secondary | ICD-10-CM | POA: Diagnosis not present

## 2023-05-14 DIAGNOSIS — N401 Enlarged prostate with lower urinary tract symptoms: Secondary | ICD-10-CM | POA: Diagnosis not present

## 2023-05-14 DIAGNOSIS — R351 Nocturia: Secondary | ICD-10-CM | POA: Diagnosis not present

## 2023-05-14 DIAGNOSIS — C61 Malignant neoplasm of prostate: Secondary | ICD-10-CM | POA: Diagnosis not present

## 2023-05-14 DIAGNOSIS — N5201 Erectile dysfunction due to arterial insufficiency: Secondary | ICD-10-CM | POA: Diagnosis not present

## 2023-05-16 DIAGNOSIS — M7061 Trochanteric bursitis, right hip: Secondary | ICD-10-CM | POA: Diagnosis not present

## 2023-05-16 DIAGNOSIS — M7062 Trochanteric bursitis, left hip: Secondary | ICD-10-CM | POA: Diagnosis not present

## 2023-08-01 DIAGNOSIS — C61 Malignant neoplasm of prostate: Secondary | ICD-10-CM | POA: Diagnosis not present

## 2023-08-01 DIAGNOSIS — N410 Acute prostatitis: Secondary | ICD-10-CM | POA: Diagnosis not present

## 2023-08-04 DIAGNOSIS — M545 Low back pain, unspecified: Secondary | ICD-10-CM | POA: Diagnosis not present

## 2023-08-04 DIAGNOSIS — M25551 Pain in right hip: Secondary | ICD-10-CM | POA: Diagnosis not present

## 2023-08-11 DIAGNOSIS — H524 Presbyopia: Secondary | ICD-10-CM | POA: Diagnosis not present

## 2023-08-11 DIAGNOSIS — M5416 Radiculopathy, lumbar region: Secondary | ICD-10-CM | POA: Diagnosis not present

## 2023-08-12 ENCOUNTER — Other Ambulatory Visit (HOSPITAL_COMMUNITY): Payer: Self-pay | Admitting: Urology

## 2023-08-12 DIAGNOSIS — C61 Malignant neoplasm of prostate: Secondary | ICD-10-CM

## 2023-08-13 DIAGNOSIS — M5416 Radiculopathy, lumbar region: Secondary | ICD-10-CM | POA: Diagnosis not present

## 2023-08-25 ENCOUNTER — Encounter (HOSPITAL_COMMUNITY)
Admission: RE | Admit: 2023-08-25 | Discharge: 2023-08-25 | Disposition: A | Discharge status: Home / Self Care | Payer: Medicare HMO | Source: Ambulatory Visit | Attending: Urology | Admitting: Urology

## 2023-08-25 DIAGNOSIS — C61 Malignant neoplasm of prostate: Secondary | ICD-10-CM | POA: Insufficient documentation

## 2023-08-25 MED ORDER — FLOTUFOLASTAT F 18 GALLIUM 296-5846 MBQ/ML IV SOLN
8.0000 | Freq: Once | INTRAVENOUS | Status: AC
  Administered 2023-08-25: 8.26 via INTRAVENOUS
  Filled 2023-08-25: qty 8

## 2023-08-26 ENCOUNTER — Ambulatory Visit: Payer: Medicare HMO | Admitting: Radiation Oncology

## 2023-08-26 DIAGNOSIS — Z6832 Body mass index (BMI) 32.0-32.9, adult: Secondary | ICD-10-CM | POA: Diagnosis not present

## 2023-08-26 DIAGNOSIS — M5416 Radiculopathy, lumbar region: Secondary | ICD-10-CM | POA: Diagnosis not present

## 2023-09-03 ENCOUNTER — Telehealth: Payer: Self-pay

## 2023-09-03 NOTE — Telephone Encounter (Signed)
I called pt to introduce myself as the Coordinator of the Prostate MDC.   1. I confirmed with the patient he is aware of his referral to the clinic 1/10, arriving @ 8:30 am.    2. I discussed the format of the clinic and the physicians he will be seeing that day.   3. I discussed where the clinic is located and how to contact me.   4. I confirmed his address and informed him I would be mailing a packet of information and forms to be completed. I asked him to bring them with him the day of his appointment.    He voiced understanding of the above. I asked him to call me if he has any questions or concerns regarding his appointments or the forms he needs to complete.

## 2023-09-18 DIAGNOSIS — M5126 Other intervertebral disc displacement, lumbar region: Secondary | ICD-10-CM | POA: Diagnosis not present

## 2023-09-18 DIAGNOSIS — M51369 Other intervertebral disc degeneration, lumbar region without mention of lumbar back pain or lower extremity pain: Secondary | ICD-10-CM | POA: Diagnosis not present

## 2023-09-18 DIAGNOSIS — M48061 Spinal stenosis, lumbar region without neurogenic claudication: Secondary | ICD-10-CM | POA: Diagnosis not present

## 2023-09-18 DIAGNOSIS — M5416 Radiculopathy, lumbar region: Secondary | ICD-10-CM | POA: Diagnosis not present

## 2023-09-18 DIAGNOSIS — M47816 Spondylosis without myelopathy or radiculopathy, lumbar region: Secondary | ICD-10-CM | POA: Diagnosis not present

## 2023-09-23 NOTE — Progress Notes (Addendum)
 RN left message with direct contact for any questions related to upcoming PMDC on 1/10.  Spoke with patient, all questions answered for upcoming PMDC on 1/10.

## 2023-09-23 NOTE — Progress Notes (Signed)
                               Care Plan Summary  Name: Adam Hess  DOB: [birth year]   Your Medical Team:   Urologist -  Dr. Gretel Ferrara, [facility]  Radiation Oncologist - Dr. [name], [facility]   Medical Oncologist - Dr. Pauletta Chihuahua, [facility]  Recommendations: 1) Hormonal Therapy/Radiation  2) Surgery     * These recommendations are based on information available as of today's consult.      Recommendations may change depending on the results of further tests or exams.    Next Steps: 1) Consider all your options and contact Vertell, your nurse navigator, with any questions or treatment decision.    When appointments need to be scheduled, you will be contacted by [facility] and/or [facility].  Questions?  Please do not hesitate to call Vertell Pont, BSN, RN at [phone] with any questions or concerns.  Vertell is your Oncology Nurse Navigator and is available to assist you while you're receiving your medical care at [facility].

## 2023-09-25 ENCOUNTER — Encounter: Payer: Self-pay | Admitting: Urology

## 2023-09-25 DIAGNOSIS — I700 Atherosclerosis of aorta: Secondary | ICD-10-CM | POA: Insufficient documentation

## 2023-09-25 NOTE — Progress Notes (Signed)
 [facility] CONSULT NOTE  Patient Care Team: Darra Hamilton, PA-C as PCP - General (Physician Assistant) Vertell Pont, RN as Oncology Nurse Navigator  ASSESSMENT & PLAN:  Adam Hess is a 68 y.o.male with history of erectile dysfunction, BPH being seen at [facility] for prostate cancer.  Patient has been undergoing active surveillance for the past few years.  Most recent biopsy showed 1 small cores of 4+4=8. GG4 prostate adenocarcinoma.  Current diagnosis: cT1cN0M0. GS 4+4=8. GG4. PSA 5.11. High risk prostate cancer Treatment: To be determined  His case was discussed at tumor board with multiple specialists including radiation oncologist, urology oncologist, pathologist, radiologist. The patient was counseled on the natural history of prostate cancer and the standard treatment options that are available for prostate cancer.   For high risk, per NCCN guideline, in patients with > 5 years of life expectancy, RP + PLND or RT + long term ADT are both options resulted in excellent long term survival.  Patient will follow-up with radiation oncology or urologic oncology for definitive treatment.  He may follow-up with medical oncology as needed.  All questions were answered. The patient knows to call the clinic with any problems, questions or concerns. No barriers to learning was detected.  Pauletta JAYSON Chihuahua, MD 1/9/20252:26 PM  CHIEF COMPLAINTS/PURPOSE OF CONSULTATION:  Prostate cancer  HISTORY OF PRESENTING ILLNESS:  Adam Hess 68 y.o. male is here because of prostate cancer.  I have reviewed his chart and materials related to his cancer extensively and collaborated history with the patient. Summary of oncologic history is as follows:  Per history he has been followed by Dr. Renda for a number of years for BPH with LUTS, ED, and rising PSA. He has been undergoing active surveillance for low risk prostate cancer.  He was initially diagnosed in May 2021.  10/27/19 PSA 6.23 01/19/20  Biopsy: 1/12 core + prostate adenocarcinoma GS 3+3=6.  GG1.  Involves 5% of 1  In June 2022 he had a repeat biopsy. Initiated felt to be Gleason score 3+4 = 7 in the same area but 2nd opinion at [facility] suggested 3+3 = 6.  He continued with ongoing active surveillance.    04/30/23 PSA 5.6 08/01/23 repeat biopsy showed 1 core at right mid different from the previous area with a small focus of prostate adenocarcinoma, GS 4+4=8.  Involves 5% of one core at 0.09 cm.  08/14/22 PSA 5.11 08/25/23 PSMA PET IMPRESSION: 1. Single focus of radiotracer activity in the prostate gland above background may represent prostate adenocarcinoma. 2. No evidence of metastatic adenopathy in the pelvis or periaortic retroperitoneum. 3. No evidence of visceral metastasis or skeletal metastasis. 4.  Aortic Atherosclerosis (ICD10-I70.0)   MEDICAL HISTORY:  Past Medical History:  Diagnosis Date   Cancer [facility])    prostate   COPD (chronic obstructive pulmonary disease) ([facility])    Hyperlipidemia     SURGICAL HISTORY: Past Surgical History:  Procedure Laterality Date   BACK SURGERY  July 2009   L-4, L-5    SOCIAL HISTORY: Social History   Socioeconomic History   Marital status: Married    Spouse name: Not on file   Number of children: Not on file   Years of education: Not on file   Highest education level: Not on file  Occupational History   Not on file  Tobacco Use   Smoking status: Former    Current packs/day: 0.00    Average packs/day: 1 pack/day for 30.0 years (30.0 ttl  pk-yrs)    Types: Cigarettes    Start date: 09/16/1977    Quit date: 09/17/2007    Years since quitting: 16.0   Smokeless tobacco: Never  Substance and Sexual Activity   Alcohol use: Yes    Alcohol/week: 2.0 standard drinks of alcohol    Types: 2 Standard drinks or equivalent per week   Drug use: Not on file   Sexual activity: Not on file  Other Topics Concern   Not on file  Social History Narrative   Not on  file   Social Drivers of Health   Financial Resource Strain: Not on file  Food Insecurity: Not on file  Transportation Needs: Not on file  Physical Activity: Not on file  Stress: Not on file  Social Connections: Unknown (01/27/2022)   [facility]   Social Network    Social Network: Not on file  Intimate Partner Violence: Unknown (12/19/2021)   Received from [facility], [facility]   HITS    Physically Hurt: Not on file    Insult or Talk Down To: Not on file    Threaten Physical Harm: Not on file    Scream or Curse: Not on file    FAMILY HISTORY: Family History  Problem Relation Age of Onset   Cancer Mother        ? type   Heart disease Father    Allergies Mother     ALLERGIES:  is allergic to sporanos [itraconazole], levofloxacin, and penicillins.  MEDICATIONS:  Current Outpatient Medications  Medication Sig Dispense Refill   albuterol (PROVENTIL) (2.5 MG/3ML) 0.083% nebulizer solution Take 2.5 mg by nebulization every 6 (six) hours as needed for wheezing or shortness of breath. (Patient not taking: Reported on 04/10/2021)     albuterol (VENTOLIN HFA) 108 (90 Base) MCG/ACT inhaler Inhale 2 puffs into the lungs every 6 (six) hours as needed for wheezing or shortness of breath. (Patient not taking: Reported on 04/10/2021)     Ascorbic Acid (VITAMIN C) 1000 MG tablet Take 1,000 mg by mouth 2 (two) times daily. (Patient not taking: Reported on 04/10/2021)     aspirin EC 81 MG tablet Take 81 mg by mouth daily. (Patient not taking: Reported on 04/10/2021)     azelastine (ASTELIN) 0.1 % nasal spray Place into both nostrils 2 (two) times daily. Use in each nostril as directed     budesonide -formoterol  (SYMBICORT ) 80-4.5 MCG/ACT inhaler Inhale 2 puffs into the lungs 2 (two) times daily. (Patient not taking: No sig reported) 1 Inhaler 11   diclofenac (VOLTAREN) 75 MG EC tablet Take 75 mg by mouth 2 (two) times daily. (Patient not taking: Reported on  04/10/2021)     esomeprazole (NEXIUM) 40 MG capsule Take 40 mg by mouth daily as needed. (Patient not taking: Reported on 04/10/2021)     ezetimibe (ZETIA) 10 MG tablet Take 10 mg by mouth daily.     Fluticasone-Umeclidin-Vilant (TRELEGY ELLIPTA) 100-62.5-25 MCG/INH AEPB Take 100 mcg by mouth daily.     hydrochlorothiazide (HYDRODIURIL) 25 MG tablet Take 25 mg by mouth daily.     Misc Natural Products (PROSTATE HEALTH) CAPS Take 1 capsule by mouth daily. (Patient not taking: Reported on 04/10/2021)     nystatin (MYCOSTATIN) 100000 UNIT/ML suspension Take 5 mLs by mouth 4 (four) times daily. (Patient not taking: Reported on 04/10/2021)     pregabalin (LYRICA) 150 MG capsule Take 150 mg by mouth 3 (three) times daily. (Patient not taking: Reported on 04/10/2021)  tadalafil (CIALIS) 5 MG tablet Take 5 mg by mouth daily.     terbinafine (LAMISIL) 250 MG tablet Take 250 mg by mouth daily. Forv three months     tiZANidine (ZANAFLEX) 4 MG tablet Take 4 mg by mouth every 6 (six) hours as needed for muscle spasms. (Patient not taking: Reported on 04/10/2021)     traMADol (ULTRAM) 50 MG tablet Take 50 mg by mouth every 6 (six) hours as needed. (Patient not taking: Reported on 04/10/2021)     UNABLE TO FIND Med Name: core complex for cholesterol (Patient not taking: Reported on 04/10/2021)     UNABLE TO FIND Med Name: lung, sinus and bronchial health caps otc daily (Patient not taking: No sig reported)     No current facility-administered medications for this visit.    REVIEW OF SYSTEMS:   All relevant systems were reviewed with the patient and are negative.  PHYSICAL EXAMINATION: ECOG PERFORMANCE STATUS: 0 - Asymptomatic VSS  GENERAL: alert, no distress and comfortable  LABORATORY DATA:  I have reviewed the data as listed Lab Results  Component Value Date   WBC 6.1 01/12/2018   HGB 14.9 01/12/2018   HCT 44.0 01/12/2018   MCV 89.5 01/12/2018   PLT 394.0 01/12/2018   No results for input(s):  NA, K, CL, CO2, GLUCOSE, BUN, CREATININE, CALCIUM, GFRNONAA, GFRAA, PROT, ALBUMIN, AST, ALT, ALKPHOS, BILITOT, BILIDIR, IBILI in the last 8760 hours.  RADIOGRAPHIC STUDIES: I have personally reviewed the radiological images as listed and agreed with the findings in the report. No results found.

## 2023-09-26 ENCOUNTER — Ambulatory Visit
Admission: RE | Admit: 2023-09-26 | Discharge: 2023-09-26 | Disposition: A | Discharge status: Home / Self Care | Payer: Medicare HMO | Source: Ambulatory Visit | Attending: Radiation Oncology | Admitting: Radiation Oncology

## 2023-09-26 ENCOUNTER — Encounter: Payer: Self-pay | Admitting: Radiation Oncology

## 2023-09-26 ENCOUNTER — Encounter: Payer: Self-pay | Admitting: Genetic Counselor

## 2023-09-26 ENCOUNTER — Inpatient Hospital Stay: Payer: Medicare HMO

## 2023-09-26 ENCOUNTER — Inpatient Hospital Stay (HOSPITAL_BASED_OUTPATIENT_CLINIC_OR_DEPARTMENT_OTHER): Payer: Medicare HMO | Admitting: Genetic Counselor

## 2023-09-26 VITALS — BP 147/99 | HR 76 | Temp 98.0°F | Resp 18 | Ht 73.0 in | Wt 250.0 lb

## 2023-09-26 DIAGNOSIS — Z87891 Personal history of nicotine dependence: Secondary | ICD-10-CM | POA: Diagnosis not present

## 2023-09-26 DIAGNOSIS — C61 Malignant neoplasm of prostate: Secondary | ICD-10-CM

## 2023-09-26 DIAGNOSIS — Z191 Hormone sensitive malignancy status: Secondary | ICD-10-CM | POA: Diagnosis not present

## 2023-09-26 DIAGNOSIS — Z8042 Family history of malignant neoplasm of prostate: Secondary | ICD-10-CM | POA: Insufficient documentation

## 2023-09-26 HISTORY — DX: Dorsalgia, unspecified: M54.9

## 2023-09-26 HISTORY — DX: Unspecified osteoarthritis, unspecified site: M19.90

## 2023-09-26 HISTORY — DX: Malignant neoplasm of prostate: C61

## 2023-09-26 NOTE — Progress Notes (Signed)
 REFERRING PROVIDER: Darra Hamilton, PA-C [address] [place],  [place] [address]  PRIMARY PROVIDER:  Darra Hamilton, PA-C  PRIMARY REASON FOR VISIT:  1. Family history of prostate cancer   2. Prostate cancer (HCC)      HISTORY OF PRESENT ILLNESS:   Adam Hess, a 68 y.o. male, was seen for a [facility] cancer genetics consultation at the request of Dr. Darra due to a personal and family history of prostate cancer.  Adam Hess presents to clinic today to discuss the possibility of a hereditary predisposition to cancer, genetic testing, and to further clarify his future cancer risks, as well as potential cancer risks for family members.   In 2022, at the age of 73, Adam Hess was diagnosed with prostate cancer.  He currently has a Gleason of 8.        CANCER HISTORY:  Oncology History  Prostate cancer (HCC)  03/12/2021 Cancer Staging   Staging form: Prostate, AJCC 8th Edition - Clinical stage from 03/12/2021: Stage IIB (cT1c, cN0, cM0, PSA: 4.3, Grade Group: 2) - Signed by Sherwood Rise, PA-C on 09/25/2023 Histopathologic type: Adenocarcinoma, NOS Stage prefix: Initial diagnosis Prostate specific antigen (PSA) range: Less than 10 Gleason primary pattern: 3 Gleason secondary pattern: 4 Gleason score: 7 Histologic grading system: 5 grade system Number of biopsy cores examined: 18 Number of biopsy cores positive: 2 Location of positive needle core biopsies: One side   04/10/2021 Initial Diagnosis   Prostate cancer (HCC)   08/01/2023 Cancer Staging   Staging form: Prostate, AJCC 8th Edition - Clinical stage from 08/01/2023: Stage IIC (cT1c, cN0, cM0, PSA: 6, Grade Group: 3) - Signed by Sherwood Rise, PA-C on 09/26/2023 Histopathologic type: Adenocarcinoma, NOS Stage prefix: Initial diagnosis Prostate specific antigen (PSA) range: Less than 10 Gleason primary pattern: 4 Gleason secondary pattern: 3 Gleason score: 7 Histologic grading system: 5 grade system Number of biopsy  cores examined: 12 Number of biopsy cores positive: 1 Location of positive needle core biopsies: One side   08/25/2023 PET scan   PSMA PET IMPRESSION: 1. Single focus of radiotracer activity in the prostate gland above background may represent prostate adenocarcinoma. 2. No evidence of metastatic adenopathy in the pelvis or periaortic retroperitoneum. 3. No evidence of visceral metastasis or skeletal metastasis. 4.  Aortic Atherosclerosis (ICD10-I70.0).     Past Medical History:  Diagnosis Date   Arthritis    Back pain    Cancer (HCC)    prostate   COPD (chronic obstructive pulmonary disease) (HCC)    Family history of prostate cancer    Hyperlipidemia    Prostate cancer [facility])     Past Surgical History:  Procedure Laterality Date   BACK SURGERY  03/16/2008   L-4, L-5   COLONOSCOPY     PROSTATE BIOPSY      Social History   Socioeconomic History   Marital status: Married    Spouse name: Not on file   Number of children: Not on file   Years of education: Not on file   Highest education level: Not on file  Occupational History   Not on file  Tobacco Use   Smoking status: Former    Current packs/day: 0.00    Average packs/day: 1 pack/day for 30.0 years (30.0 ttl pk-yrs)    Types: Cigarettes    Start date: 09/16/1977    Quit date: 09/17/2007    Years since quitting: 16.0   Smokeless tobacco: Never  Vaping Use   Vaping status: Never Used  Substance and Sexual Activity   Alcohol use: Not Currently    Comment: 1 beer/wine per week   Drug use: Never   Sexual activity: Not on file  Other Topics Concern   Not on file  Social History Narrative   Not on file   Social Drivers of Health   Financial Resource Strain: Not on file  Food Insecurity: Not on file  Transportation Needs: Not on file  Physical Activity: Not on file  Stress: Not on file  Social Connections: Unknown (01/27/2022)   Received from [facility]   Social Network    Social Network:  Not on file     FAMILY HISTORY:  We obtained a detailed, 4-generation family history.  Significant diagnoses are listed below: Family History  Problem Relation Age of Onset   Cancer Mother        ? type   Allergies Mother    Heart disease Father    Prostate cancer Father 45   Leukemia Brother      The patient has two daughters who are cancer free. He has two brothers and a sister, one brother may have leukemia.  Both parents are deceased.  The patient's father had prostate cancer.  He had four brothers and a sister who did not have cancer.  There is no other reported cancer history.  The patient's mother had some form of cancer when the patient was in elementary school.  She had a brother and sister who were cancer free.  There is no other reported family history of cancer.  Adam Hess is unaware of previous family history of genetic testing for hereditary cancer risks. There is nos no reported Ashkenazi Jewish ancestry. There is no known consanguinity.  GENETIC COUNSELING ASSESSMENT: Adam Hess is a 68 y.o. male with a personal and family history of prostate cancer which is somewhat suggestive of a hereditary prostate cancer syndrome and predisposition to cancer given his high risk Gleason 8 prostate cancer and the family history of prostate cancer. We, therefore, discussed and recommended the following at today's visit.   DISCUSSION: We discussed that, in general, most cancer is not inherited in families, but instead is sporadic or familial. Sporadic cancers occur by chance and typically happen at older ages (>50 years) as this type of cancer is caused by genetic changes acquired during an individual's lifetime. Some families have more cancers than would be expected by chance; however, the ages or types of cancer are not consistent with a known genetic mutation or known genetic mutations have been ruled out. This type of familial cancer is thought to be due to a combination of  multiple genetic, environmental, hormonal, and lifestyle factors. While this combination of factors likely increases the risk of cancer, the exact source of this risk is not currently identifiable or testable.  We discussed that 5 - 10% of cancer is hereditary, with most cases of prostate cancer associated with BRCA mutations.  There are other genes that can be associated with hereditary prostate cancer syndromes.  Most of these increase the risk for other cancers as well.  We discussed that testing is beneficial for several reasons including knowing how to follow individuals after completing their treatment, identifying whether potential treatment options such as PARP inhibitors would be beneficial, and understand if other family members could be at risk for cancer and allow them to undergo genetic testing.   We reviewed the characteristics, features and inheritance patterns of hereditary cancer syndromes. We also discussed  genetic testing, including the appropriate family members to test, the process of testing, insurance coverage and turn-around-time for results. We discussed the implications of a negative, positive, carrier and/or variant of uncertain significant result. Adam Hess  was offered a common hereditary cancer panel (36+ genes) and an expanded pan-cancer panel (70+ genes). Adam Hess was informed of the benefits and limitations of each panel, including that expanded pan-cancer panels contain genes that do not have clear management guidelines at this point in time.  We also discussed that as the number of genes included on a panel increases, the chances of variants of uncertain significance increases. Adam Hess decided to pursue genetic testing for the CancerNext+RNA gene panel.   The [facility] CancerNext+RNAinsight Panel includes sequencing, rearrangement analysis, and RNA analysis for the following 39 genes: APC, ATM, BAP1, BARD1, BMPR1A, BRCA1, BRCA2, BRIP1, CDH1, CDKN2A, CHEK2, FH, FLCN,  MET, MLH1, MSH2, MSH6, MUTYH, NF1, NTHL1, PALB2, PMS2, PTEN, RAD51C, RAD51D, SMAD4, STK11, TP53, TSC1, TSC2, and VHL (sequencing and deletion/duplication); AXIN2, HOXB13, MBD4, MSH3, POLD1 and POLE (sequencing only); EPCAM and GREM1 (deletion/duplication only).   Based on Adam Hess personal and family history of cancer, he meets medical criteria for genetic testing.  Despite that he meets criteria, he may still have an out of pocket cost. We discussed that if his out of pocket cost for testing is over $100, the laboratory will call and confirm whether he wants to proceed with testing.  If the out of pocket cost of testing is less than $100 he will be billed by the genetic testing laboratory.   We discussed that some people do not want to undergo genetic testing due to fear of genetic discrimination.  The Genetic Information Nondiscrimination Act (GINA) was signed into federal law in 2008. GINA prohibits health insurers and most employers from discriminating against individuals based on genetic information (including the results of genetic tests and family history information). According to GINA, health insurance companies cannot consider genetic information to be a preexisting condition, nor can they use it to make decisions regarding coverage or rates. GINA also makes it illegal for most employers to use genetic information in making decisions about hiring, firing, promotion, or terms of employment. It is important to note that GINA does not offer protections for life insurance, disability insurance, or long-term care insurance. GINA does not apply to those in the [employer], those who work for companies with less than 15 employees, and new life insurance or long-term disability insurance policies.  Health status due to a cancer diagnosis is not protected under GINA. More information about GINA can be found by visiting [employer].  PLAN: Adam Hess did not wish to pursue genetic testing at today's  visit. He wants to talk with his family and get more information about his mother's cancer diagnosis as well as any other diagnosis in the family. We understand this decision and remain available to coordinate genetic testing at any time in the future. We, therefore, recommend Adam Hess continue to follow the cancer screening guidelines given by his primary healthcare provider.  Lastly, we encouraged Adam Hess to remain in contact with cancer genetics annually so that we can continuously update the family history and inform him of any changes in cancer genetics and testing that may be of benefit for this family.   Adam Hess questions were answered to his satisfaction today. Our contact information was provided should additional questions or concerns arise. Thank you for the referral and allowing us  to share in  the care of your patient.   Rayquan Amrhein P. Perri, MS, CGC Licensed, Patent attorney [email or web address] phone: [phone]  50 minutes were spent on the date of the encounter in service to the patient including preparation, face-to-face consultation, documentation and care coordination.  The patient brought his wife. Drs. Lanny Stalls, and/or Gudena were available for questions, if needed..    _______________________________________________________________________ For Office Staff:  Number of people involved in session: 2 Was an Intern/ student involved with case: no

## 2023-09-26 NOTE — Progress Notes (Signed)
 Radiation Oncology         [phone] ________________________________  Multidisciplinary Prostate Cancer Clinic  Radiation Oncology Consultation  Name: Adam Hess MRN: [id number]  Date: 09/26/2023  DOB: 1955-12-09  RR:Onwh, Glendia, DEVONNA Renda Glance, MD   REFERRING PHYSICIAN: Renda Glance, MD  DIAGNOSIS: 68 y.o. gentleman with stage T1c adenocarcinoma of the prostate with a Gleason's score of 4+3 and a PSA of 5.96    ICD-10-CM   1. Prostate cancer (HCC)  C61       HISTORY OF PRESENT ILLNESS::Adam Hess is a 68 y.o. gentleman. We previously met this patient in multidisciplinary prostate clinic in 03/2021 to discuss treatment options for Gleason 3+4 prostate cancer.   In summary, he was initially diagnosed with Gleason 3+3 prostate cancer in 5% of one core on 01/19/20 with a PSA of 6.23. He opted for active surveillance at that time. A surveillance prostate MRI in 09/2020 showed a PI-RADS 3 lesion. He proceeded with fusion biopsy on 03/12/21 and was felt to have mild progression to Gleason 3+4, involving only 10% of two cores. PSA at that time was overall stable at 5.73. Following this, his case was presented and he was seen in our multidisciplinary prostate cancer clinic on 04/10/21. At that time, he was leaning towards continued active surveillance. The patient sought out a second opinion on his pathology reading by Brinda Meth, who felt his pathology showed only Gleason 3+3 disease. Dr. Renda also obtained an Oncotype DX test on the pathology and his score was 20 out of 100 and indicated a 31% likelihood for adverse pathology if the patient underwent radical prostatectomy. He opted to continue with active surveillance.  More recently, his PSA continued to remain stable at 5.96 in 04/2023 and following further discussion with Dr. Renda, he ultimately agreed to proceed with surveillance prostate biopsy on 08/01/23. The prostate volume measured 66.3 cc.  Out of 12 core biopsies,  only 1 was positive.  The maximum Gleason score was originally read as 4+4 in the right apex but upon further review of the pathology prior to our discussion this morning, Dr. Stuart felt that this was actually a Gleason 4+3 due to the minimal pattern four.  The patient reviewed the biopsy results with his urologist and he has kindly been referred today to the multidisciplinary prostate cancer clinic for presentation of pathology and radiology studies in our conference for discussion of potential radiation treatment options and clinical evaluation.   PREVIOUS RADIATION THERAPY: No  PAST MEDICAL HISTORY:  has a past medical history of Arthritis, Back pain, Cancer (HCC), COPD (chronic obstructive pulmonary disease) (HCC), Hyperlipidemia, and Prostate cancer (HCC).    PAST SURGICAL HISTORY: Past Surgical History:  Procedure Laterality Date   BACK SURGERY  03/16/2008   L-4, L-5   COLONOSCOPY     PROSTATE BIOPSY      FAMILY HISTORY: family history includes Allergies in his mother; Cancer in his mother; Heart disease in his father.  SOCIAL HISTORY:  reports that he quit smoking about 16 years ago. His smoking use included cigarettes. He started smoking about 46 years ago. He has a 30 pack-year smoking history. He has never used smokeless tobacco. He reports that he does not currently use alcohol. He reports that he does not use drugs.  ALLERGIES: Sporanos [itraconazole], Levofloxacin, and Penicillins  MEDICATIONS:  Current Outpatient Medications  Medication Sig Dispense Refill   cholecalciferol (VITAMIN D3) 25 MCG (1000 UNIT) tablet Take 1,000 Units by mouth daily.  cyclobenzaprine (FLEXERIL) 10 MG tablet SMARTSIG:0.5-1 Tablet(s) By Mouth 2-3 Times Daily PRN     gabapentin (NEURONTIN) 600 MG tablet Take 300-600 mg by mouth at bedtime.     meloxicam (MOBIC) 15 MG tablet Take 15 mg by mouth daily.     Ascorbic Acid (VITAMIN C) 1000 MG tablet Take 1,000 mg by mouth 2 (two) times daily.  (Patient not taking: Reported on 04/10/2021)     Fluticasone-Umeclidin-Vilant (TRELEGY ELLIPTA) 100-62.5-25 MCG/INH AEPB Take 100 mcg by mouth daily.     tadalafil (CIALIS) 5 MG tablet Take 5 mg by mouth daily.     Zinc Picolinate POWD Take 100 mg by mouth daily.     No current facility-administered medications for this encounter.    REVIEW OF SYSTEMS:  On review of systems, the patient reports that he is doing well overall. He denies any chest pain, shortness of breath, cough, fevers, chills, night sweats, unintended weight changes. He denies any bowel disturbances, and denies abdominal pain, nausea or vomiting. He denies any new musculoskeletal or joint aches or pains. His IPSS was 11, indicating mild to moderate urinary symptoms. His SHIM was 24, indicating he does not have erectile dysfunction. A complete review of systems is obtained and is otherwise negative.   PHYSICAL EXAM:  Wt Readings from Last 3 Encounters:  09/26/23 250 lb (113.4 kg)  04/10/21 250 lb 6.4 oz (113.6 kg)  01/02/21 233 lb 11 oz (106 kg)   Temp Readings from Last 3 Encounters:  09/26/23 98 F (36.7 C) (Oral)  04/10/21 97.8 F (36.6 C)  01/02/21 98.4 F (36.9 C) (Oral)   BP Readings from Last 3 Encounters:  09/26/23 (!) 147/99  04/10/21 129/76  01/02/21 140/85   Pulse Readings from Last 3 Encounters:  09/26/23 76  04/10/21 81  01/02/21 70    /10  In general this is a well appearing Caucasian man in no acute distress. He's alert and oriented x4 and appropriate throughout the examination. Cardiopulmonary assessment is negative for acute distress and he exhibits normal effort.    KPS = 100  100 - Normal; no complaints; no evidence of disease. 90   - Able to carry on normal activity; minor signs or symptoms of disease. 80   - Normal activity with effort; some signs or symptoms of disease. 57   - Cares for self; unable to carry on normal activity or to do active work. 60   - Requires occasional assistance,  but is able to care for most of his personal needs. 50   - Requires considerable assistance and frequent medical care. 40   - Disabled; requires special care and assistance. 30   - Severely disabled; hospital admission is indicated although death not imminent. 20   - Very sick; hospital admission necessary; active supportive treatment necessary. 10   - Moribund; fatal processes progressing rapidly. 0     - Dead  Karnofsky DA, Abelmann WH, Craver LS and Burchenal [facility] [phone]) The use of the nitrogen mustards in the palliative treatment of carcinoma: with particular reference to bronchogenic carcinoma Cancer 1 634-56   LABORATORY DATA:  Lab Results  Component Value Date   WBC 6.1 01/12/2018   HGB 14.9 01/12/2018   HCT 44.0 01/12/2018   MCV 89.5 01/12/2018   PLT 394.0 01/12/2018   Lab Results  Component Value Date   NA 137 05/27/2011   K 4.1 05/27/2011   CL 102 05/27/2011   CO2 28 05/27/2011   Lab Results  Component Value Date   ALT 29 05/27/2011   AST 25 05/27/2011   ALKPHOS 63 05/27/2011   BILITOT 0.2 (L) 05/27/2011     RADIOGRAPHY: No results found.    IMPRESSION/PLAN: 68 y.o. gentleman with Stage T1c adenocarcinoma of the prostate with a Gleason score of 4+3 and a PSA of 5.96.    We discussed the patient's workup and outlined the nature of prostate cancer in this setting. The patient's T stage, Gleason's score, and PSA put him into the unfavorable intermediate risk group which is upgraded from the Gleason 3+4 favorable intermediate risk seen on prior biopsy. Accordingly, he is eligible for a variety of potential treatment options including prostatectomy, brachytherapy or 5.5 weeks of external radiation +/- ST-ADT. We discussed the available radiation techniques, and focused on the details and logistics of delivery. The patient may not be an ideal candidate for brachytherapy with a prostate volume of 66.3 cc prior to downsizing. We discussed that based on his prostate volume, he  would require beginning treatment with a 5 alpha reductase inhibitor and/or ADT for at least 3 months to allow for downsizing of the prostate prior to initiating brachytherapy. We discussed and outlined the risks, benefits, short and long-term effects associated with radiotherapy and compared and contrasted these with prostatectomy. We discussed the role of SpaceOAR gel in reducing the rectal toxicity associated with radiotherapy. We also detailed the role of ADT in the treatment of unfavorable intermediate risk prostate cancer and outlined the associated side effects that could be expected with this therapy. He appears to have a good understanding of his disease and our treatment recommendations which are of curative intent.  He was encouraged to ask questions that were answered to his stated satisfaction.  At the end of the conversation the patient is undecided regarding his treatment preference and will also discuss surgical options with Dr. Renda as part of the multidisciplinary conference this morning.  Should he elect to proceed with brachytherapy, he would need to start a 5-ARI now and we would reassess prostate volume at the time of his preseed CT Sim, to confirm he is indeed a good candidate for brachytherapy.  He has our contact information and will let us  know once he reaches a decision so that we can proceed with treatment planning accordingly.  We enjoyed meeting him and his wife today and look forward to continuing to participate in his care.  We personally spent 60 minutes in this encounter including chart review, reviewing radiological studies, meeting face-to-face with the patient, entering orders and completing documentation.    Sabra MICAEL Rusk, PA-C    [name], MD  [facility]  Radiation Oncology Direct Dial: [phone]  Fax: [phone] [email or web address]  Skype  LinkedIn

## 2023-09-26 NOTE — Consult Note (Signed)
 Multi-Disciplinary Clinic     09/26/2023   --------------------------------------------------------------------------------   Adam Hess  MRN: [id number]  DOB: 03-09-1956, 68 year old Male  SSN: [id number]   PRIMARY CARE:  Adam Bullock, PA  PRIMARY CARE FAX:  [phone]  REFERRING:  Adam Hess  PROVIDER:  Gretel Hess, M.D.  LOCATION:  [facility] [phone]     --------------------------------------------------------------------------------   CC/HPI: Prostate cancer   Location of consult: [facility] - Prostate Cancer Multidisciplinary Clinic   Adam Hess is a 68 year old gentleman with a PMH significant for mild COPD, GERD, hyperlipidemia, and anxiety. He was noted to have a rising PSA that increased to 6.32 in 2021. 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 After reviewing options, he elected active surveillance management. An MRI of the prostate was performed in January 2022 and demonstrated right apical and right mid PI-RADS 3 lesions. He eventually underwent an MR/US  fusion biopsy on 03/12/21 that indicated upgraded Gleason 3+4=7 adenocarcinoma with 2 out of 20 biopsies positive for malignancy. His PSA at that time was 4.34. He was seen in the multidisciplinary clinic in July 2022 for discussion considering his upgraded disease. He had a second opinion on his pathology [facility] suggested his pathology was Gleason 3+3=6. Oncotype Dx testing was performed and indicated a 31$ risk of adverse pathology. Ultimately, he elected to continue with active surveillance management.   His PSA most recently was 5.96 and, after some reluctance, agreed to a repeat biopsy on 08/01/23. This demonstrated Gleason 4+4=8 adenocarcinoma but in only 5 % of one biopsy core. PSMA PET imaging on 08/25/23 indicated no metastatic disease.   PMH: He has a  history of anxiety, COPD, GERD, and hyperlipidemia.  PSH: No abdominal surgeries.   Initial diagnosis: May 2021  TNM stage: cT1c Nx Mx  PSA: 6.23  Gleason score: 3+3=6 (Grade group 1)  Biopsy (01/19/20): 1/12 cores positive  Left: Benign  Right: Right lateral mid (5%)  Prostate volume: 50.4 cc  PSAD: 0.12   Surveillance:  Jan 2022: MRI - 1.3 cm PI-RADS 3 lesion at right apex, 1.3 cm PI-RADS 3 lesion at right mid, 1.4 cm left acetabular lesion probably a bone island  Jun 2022: MR/US  fusion biopsy - R apical targeted biopsy (10%, 3+4-7), R mid (20%, 3+4=7), Vol 59.7 cc  Nov 2024: 12 core biopsy - 1/12 cores positive (R mid - 5%, Gleason 4+4=8), Vol 66.3 cc   Nomogram  OC disease: 58%  EPE: 39%  SVI: 5%  LNI: 8%  PFS (5 year, 10 year): 60%, 44%    Baseline urinary function: IPSS is 13. He does take tadalafil 5 mg daily. This is been helpful form. He still has some bothersome symptoms include nocturia and frequency and hesitancy.  Baseline erectile function: SHIM score is 19. He is adequately treated with tadalafil 5 mg daily.     ALLERGIES: Levaquin TABS Penicillins Sporanox    MEDICATIONS: Cialis 1 PO Daily  Albuterol Sulfate  Cyclobenzaprine Hcl  Meloxicam  Pregabalin  Tramadol Hcl  Trelegy Ellipta     GU PSH: Prostate Needle Biopsy - 08/01/2023, 2022, 2021       PSH Notes: Back Surgery   NON-GU PSH: Surgical Pathology, Gross And Microscopic Examination For Prostate Needle - 08/01/2023, 2022, 2021     GU PMH: Prostate Cancer - 08/01/2023, - 05/14/2023, -  08/21/2022, - 02/13/2022, - 06/15/2021, - 04/10/2021, - 2022, - 2022, - 2021 BPH w/LUTS - 05/14/2023, - 12/03/2022, - 08/21/2022, - 02/13/2022, - 06/15/2021, - 04/10/2021, - 2022, Benign prostatic hyperplasia (BPH) with straining on urination, - 2016 ED due to arterial insufficiency - 05/14/2023, - 08/21/2022, - 02/13/2022, - 06/15/2021, - 04/10/2021, - 2022, Erectile dysfunction due to arterial insufficiency, - 2016 Nocturia -  05/14/2023, - 08/21/2022, - 02/13/2022, - 06/15/2021, - 04/10/2021, - 2022, - 2018 Low back pain - 12/03/2022 Elevated PSA, Elevated prostate specific antigen (PSA) - 2016 Renal calculus, Nephrolithiasis - 2014      PMH Notes:   1) Prostate cancer: He was noted to have a rising PSA that increased to 6.32. 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 After reviewing options, he elected active surveillance management. He was noted to have upgrade Gleason 3+4=7 disease on a biopsy in June 2022 although a 2nd opinion at [facility] read his pathology as still Gleason 3+3=6.   PMH: He has a history of anxiety, COPD, GERD, and hyperlipidemia.  PSH: No abdominal surgeries.   Initial diagnosis: May 2021  TNM stage: cT1c Nx Mx  PSA: 6.23  Gleason score: 3+3=6 (Grade group 1)  Biopsy (01/19/20): 1/12 cores positive  Left: Benign  Right: Right lateral mid (5%)  Prostate volume: 50.4 cc  PSAD: 0.12   Surveillance:  Jan 2022: MRI - 1.3 cm PI-RADS 3 lesion at right apex, 1.3 cm PI-RADS 3 lesion at right mid, 1.4 cm left acetabular lesion probably a bone island  Jun 2022: MR/US  fusion biopsy - R apical targeted biopsy (10%, 3+4-7), R mid (20%, 3+4=7), Vol 59.7 cc  Jul 2022: [facility] meeting  Aug 2022: 2nd opinion [facility] - read path as Gleason 3+3=22 May 2021: Oncotype Dx testing - 31% risk of adverse pathology (15% risk of high grade disease, 14% risk of T3 or greater disease)   Baseline urinary function: IPSS is 13. He does take tadalafil 5 mg daily. This is been helpful form. He still has some bothersome symptoms include nocturia and frequency and hesitancy.  Baseline erectile function: SHIM score is 19. He is adequately treated with tadalafil 5 mg daily.    2) Erectile dysfunction: He does have erectile dysfunction which has been adequately treated with Cialis. He has tried the Cialis 20 mg and Cialis 5 mg and  has found 20 mg on demand therapy to be most successful. Baseline SHIM score is 15. He does have a history of a traumatic back injury and prior back surgery which may be a contributing factor.   Current treatment: Cialis 5 mg daily   3) BPH/LUTS: His baseline symptoms include a sense of incomplete emptying, intermittency, weak stream, and nocturia as well as postvoid dribbling. Post void dribbling has been his most bothersome symptom. IPSS at baseline is 19.   Current treatment: Cialis 5 mg     NON-GU PMH: Anxiety, Anxiety (Symptom) - 2014 COPD, Chronic Obstructive Pulmonary Disease - 2014 Congenital hiatus hernia GERD Hypercholesterolemia    FAMILY HISTORY: Acute Myocardial Infarction - Runs In Family cardiac disorder - Runs In Family Diabetes - Runs In Family Family Health Status Children _4__ Living Daughter - Runs In Family Myocardial Infarction - Runs In Family nephrolithiasis - Runs In Family No pertinent family history - Runs In Family Prostate Cancer - Runs In Family Septicemia - Runs In Family   SOCIAL  HISTORY: Marital Status: Single Preferred Language: English; Ethnicity: Not Hispanic Or Latino; Race: White Current Smoking Status: Patient has never smoked.   Tobacco Use Assessment Completed: Used Tobacco in last 30 days? Does not drink anymore.  Drinks 1 caffeinated drink per day.     Notes: Never A Smoker, Marital History - Single, Alcohol Use   REVIEW OF SYSTEMS:    GU Review Male:   Patient denies frequent urination, hard to postpone urination, burning/ pain with urination, get up at night to urinate, leakage of urine, stream starts and stops, trouble starting your streams, and have to strain to urinate .  Gastrointestinal (Lower):   Patient denies diarrhea and constipation.  Gastrointestinal (Upper):   Patient denies nausea and vomiting.  Constitutional:   Patient denies fever, night sweats, weight loss, and fatigue.  Skin:   Patient denies skin rash/ lesion and  itching.  Eyes:   Patient denies blurred vision and double vision.  Ears/ Nose/ Throat:   Patient denies sore throat and sinus problems.  Hematologic/Lymphatic:   Patient denies swollen glands and easy bruising.  Cardiovascular:   Patient denies leg swelling and chest pains.  Respiratory:   Patient denies cough and shortness of breath.  Endocrine:   Patient denies excessive thirst.  Musculoskeletal:   Patient denies back pain and joint pain.  Neurological:   Patient denies headaches and dizziness.  Psychologic:   Patient denies depression and anxiety.   VITAL SIGNS: None   MULTI-SYSTEM PHYSICAL EXAMINATION:    Constitutional: Well-nourished. No physical deformities. Normally developed. Good grooming.     Complexity of Data:  Lab Test Review:   PSA  Records Review:   Previous Patient Records  X-Ray Review: PET- PSMA Scan: Reviewed Films.     04/30/23 08/14/22 02/06/22 06/07/21 10/06/20 10/27/19 12/12/17 06/11/17  PSA  Total PSA 5.96 ng/mL 5.11 ng/mL 5.64 ng/mL 5.73 ng/mL 4.34 ng/mL 6.23 ng/mL 3.92 ng/mL 3.30 ng/mL  Free PSA      1.07 ng/mL    % Free PSA      17 % PSA      PROCEDURES: None   ASSESSMENT:      ICD-10 Details  1 GU:   Prostate Cancer - C61    PLAN:           Document Letter(s):  Created for Patient: Clinical Summary   Created for Patient: Clinical Summary         Notes:   1. Prostate cancer: We reviewed his recent biopsy results indicating low volume but now high-grade prostate cancer. Review of his pathology suggested that this might be more consistent with Gleason 4+3 = 7 disease than 4+4 = 8 disease. As such, we discussed that he has at least unfavorable intermediate risk disease. Considering his prior biopsy information, and taking into account his age and life expectancy, I did recommend therapy of curative intent at this point for [place]renfeld. He has seen both Dr. Patrcia and Dr. Tina today to discuss options.   The patient was counseled about the natural  history of prostate cancer and the standard treatment options that are available for prostate cancer. It was explained to him how his age and life expectancy, clinical stage, Gleason score/prognostic grade group, and PSA (and PSA density) affect his prognosis, the decision to proceed with additional staging studies, as well as how that information influences recommended treatment strategies. We discussed the roles for active surveillance, radiation therapy, surgical therapy, androgen deprivation, as well as ablative therapy and other  investigational options for the treatment of prostate cancer as appropriate to his individual cancer situation. We discussed the risks and benefits of these options with regard to their impact on cancer control and also in terms of potential adverse events, complications, and impact on quality of life particularly related to urinary and sexual function. The patient was encouraged to ask questions throughout the discussion today and all questions were answered to his stated satisfaction. In addition, the patient was provided with and/or directed to appropriate resources and literature for further education about prostate cancer and treatment options.   We discussed surgical therapy for prostate cancer including the different available surgical approaches. We discussed, in detail, the risks and expectations of surgery with regard to cancer control, urinary control, and erectile function as well as the expected postoperative recovery process. Additional risks of surgery including but not limited to bleeding, infection, hernia formation, nerve damage, lymphocele formation, bowel/rectal injury potentially necessitating colostomy, damage to the urinary tract resulting in urine leakage, urethral stricture, and the cardiopulmonary risks such as myocardial infarction, stroke, death, venothromboembolism, etc. were explained. The risk of open surgical conversion for robotic/laparoscopic  prostatectomy was also discussed.   At this time, he appears agreeable to proceed with treatment. He appears to be most interested in short-term androgen deprivation and IMRT. Considering his baseline prostate volume and symptoms, he seems to want to avoid brachytherapy. He will notify us  of his result.   2. BPH/LUTS: Continue tadalafil 5 mg daily.   3. Erectile dysfunction: Continue tadalafil 5 mg daily.   CC: Dr. [name]  Dr. Pauletta Tina Adam Nena, PA-C    E & M CODES: We spent 42 minutes dedicated to evaluation and management time, including face to face interaction, discussions on coordination of care, documentation, result review, and discussion with others as applicable.

## 2023-09-29 DIAGNOSIS — M25561 Pain in right knee: Secondary | ICD-10-CM | POA: Diagnosis not present

## 2023-09-29 DIAGNOSIS — M545 Low back pain, unspecified: Secondary | ICD-10-CM | POA: Diagnosis not present

## 2023-10-03 NOTE — Progress Notes (Signed)
RN spoke with patient to follow up after recent PMDC visit on 1/10.  Patient would like to take a few more weeks to review his treatment options prior to finalizing.  Patient does have his grandchildren's birthday at the end of the month and will be traveling to [place].  He plans to reach out early February to review any questions/finalize treatment plan.  RN encouraged patient to reach out anytime with questions prior.  Will continue to follow.

## 2023-10-04 DIAGNOSIS — C61 Malignant neoplasm of prostate: Secondary | ICD-10-CM | POA: Diagnosis not present

## 2023-10-04 DIAGNOSIS — F411 Generalized anxiety disorder: Secondary | ICD-10-CM | POA: Diagnosis not present

## 2023-10-04 DIAGNOSIS — M51362 Other intervertebral disc degeneration, lumbar region with discogenic back pain and lower extremity pain: Secondary | ICD-10-CM | POA: Diagnosis not present

## 2023-10-04 DIAGNOSIS — Z0000 Encounter for general adult medical examination without abnormal findings: Secondary | ICD-10-CM | POA: Diagnosis not present

## 2023-10-07 DIAGNOSIS — M5416 Radiculopathy, lumbar region: Secondary | ICD-10-CM | POA: Diagnosis not present

## 2023-10-13 DIAGNOSIS — C61 Malignant neoplasm of prostate: Secondary | ICD-10-CM | POA: Diagnosis not present

## 2023-10-17 DIAGNOSIS — H16223 Keratoconjunctivitis sicca, not specified as Sjogren's, bilateral: Secondary | ICD-10-CM | POA: Diagnosis not present

## 2023-10-20 DIAGNOSIS — C61 Malignant neoplasm of prostate: Secondary | ICD-10-CM | POA: Diagnosis not present

## 2023-10-22 DIAGNOSIS — Z139 Encounter for screening, unspecified: Secondary | ICD-10-CM | POA: Diagnosis not present

## 2023-10-22 DIAGNOSIS — C61 Malignant neoplasm of prostate: Secondary | ICD-10-CM | POA: Diagnosis not present

## 2023-10-23 DIAGNOSIS — C61 Malignant neoplasm of prostate: Secondary | ICD-10-CM | POA: Diagnosis not present

## 2023-10-23 NOTE — Progress Notes (Signed)
 RN returned patient's wife call.  LM for call back.

## 2023-10-24 DIAGNOSIS — R972 Elevated prostate specific antigen [PSA]: Secondary | ICD-10-CM | POA: Diagnosis not present

## 2023-10-24 NOTE — Progress Notes (Signed)
 RN spoke with patient's wife.  They are undergoing an MRI of prostate in [place]  after having a second opinion at [facility].    Patient remains undecided with his treatment decision and will await results of MRI.    Will continue to follow.

## 2023-10-31 ENCOUNTER — Inpatient Hospital Stay
Admission: RE | Admit: 2023-10-31 | Discharge: 2023-10-31 | Disposition: A | Discharge status: Home / Self Care | Payer: Self-pay | Source: Ambulatory Visit | Attending: Radiation Oncology | Admitting: Radiation Oncology

## 2023-10-31 DIAGNOSIS — C61 Malignant neoplasm of prostate: Secondary | ICD-10-CM

## 2023-10-31 NOTE — Progress Notes (Signed)
Spoke with patient and his wife to follow up.  Patient recently had a second opinion at [facility] where they did an MRI of prostate.    Patient would like to have local urologist review imaging and provide feedback.   RN placed request to have images from [facility] power shared with report.  Once received will update urologist for review.

## 2023-11-01 DIAGNOSIS — N429 Disorder of prostate, unspecified: Secondary | ICD-10-CM | POA: Diagnosis not present

## 2023-11-07 ENCOUNTER — Encounter: Payer: Self-pay | Admitting: Radiation Oncology

## 2023-11-07 NOTE — Progress Notes (Signed)
  Radiation Oncology         [phone]) [phone] ________________________________  Name: Adam Hess MRN: [id number]  Date: 11/07/2023  DOB: 1955/10/01  Chart Note:  This patient is under consideration for treatment of his prostate cancer and one option is LDR brachytherapy.  Prior to considering prostate brachytherapy, we seek to measure the volume of the gland.  There was some discrepency between his TRUS (66.3 cc) at [facility] and his MRI (60 cc) at [facility].  In both instances the volume was calculated using a mathematical conversion from height, width and depth.  We uploated the patient's T2 MRI from [facility] and performed planimetric volumetry contouring the axial area of each cross section of the prostate.  The software in MIM automatically multiplies the area on each slice by the slice thickness to calculate the volume of each slice and sums them together for a more accurate 3D volume assessment.  Using planimetric volumetry, the true MRI volume of the prostate is 66.99 cc.      ________________________________  Artist Pais. Kathrynn Running, M.D.

## 2023-11-10 DIAGNOSIS — J209 Acute bronchitis, unspecified: Secondary | ICD-10-CM | POA: Diagnosis not present

## 2023-11-14 NOTE — Progress Notes (Signed)
 RN left message for patient to call back to follow up on treatment decision and assess any follow up questions.    Patient has had questions regarding prostate volume and hormonal therapy.  RN has been reviewing with MD to ensure all recommendations have been given to patient.    Patient has been made aware that he recommendation is short term hormonal therapy (ADT) followed by 5.5 weeks of daily radiation to offer around a 90% cure rate.  If he chooses to proceed without the hormonal therapy and just the 5.5 weeks of daily radiation it would be bring this percentage down to around 85%.    For brachytherapy the original recommendation is to start a 5-ARI and reassess prostate volume at the time of his preseed CT Sim, to confirm he is indeed a good candidate for brachytherapy. However, Dr. Kathrynn Running is agreeable to proceed without 5-ARI due to patient not having pre-existing urinary symptoms and review of additional MRI from [facility].

## 2023-11-16 DIAGNOSIS — R059 Cough, unspecified: Secondary | ICD-10-CM | POA: Diagnosis not present

## 2023-11-16 DIAGNOSIS — Z20822 Contact with and (suspected) exposure to covid-19: Secondary | ICD-10-CM | POA: Diagnosis not present

## 2023-11-16 DIAGNOSIS — J40 Bronchitis, not specified as acute or chronic: Secondary | ICD-10-CM | POA: Diagnosis not present

## 2023-11-21 NOTE — Progress Notes (Signed)
 RN spoke with patient and he is still considering all his options.  Patient is currently being treated for a URI, and would prefer to have additional conversation once recovered in the next 1-2 weeks.  RN will follow up.

## 2023-12-05 NOTE — Progress Notes (Signed)
 RN reached out to patient to follow up with treatment decision or additional questions.  Patient unable to talk freely due to location, requested call back on Monday.

## 2023-12-08 NOTE — Progress Notes (Signed)
 RN spoke with patient to follow up regarding treatment decision for his stage T1c adenocarcinoma of the prostate with a Gleason's score of 4+3 and a PSA of 5.96.   Patient verbalized he feels like he would benefit from having additional appointment with Dr. Kathrynn Running to review treatment options. RN will coordinate for this and continue to follow.

## 2023-12-18 ENCOUNTER — Ambulatory Visit
Admission: RE | Admit: 2023-12-18 | Discharge: 2023-12-18 | Disposition: A | Discharge status: Home / Self Care | Source: Ambulatory Visit | Attending: Radiation Oncology | Admitting: Radiation Oncology

## 2023-12-18 ENCOUNTER — Encounter: Payer: Self-pay | Admitting: Urology

## 2023-12-18 ENCOUNTER — Ambulatory Visit
Admission: RE | Admit: 2023-12-18 | Discharge: 2023-12-18 | Disposition: A | Discharge status: Home / Self Care | Source: Ambulatory Visit | Attending: Urology | Admitting: Urology

## 2023-12-18 VITALS — BP 160/85 | HR 81 | Temp 97.8°F | Resp 18 | Ht 73.0 in | Wt 244.6 lb

## 2023-12-18 DIAGNOSIS — M129 Arthropathy, unspecified: Secondary | ICD-10-CM | POA: Diagnosis not present

## 2023-12-18 DIAGNOSIS — Z87891 Personal history of nicotine dependence: Secondary | ICD-10-CM | POA: Diagnosis not present

## 2023-12-18 DIAGNOSIS — Z191 Hormone sensitive malignancy status: Secondary | ICD-10-CM | POA: Diagnosis not present

## 2023-12-18 DIAGNOSIS — Z79899 Other long term (current) drug therapy: Secondary | ICD-10-CM | POA: Insufficient documentation

## 2023-12-18 DIAGNOSIS — C61 Malignant neoplasm of prostate: Secondary | ICD-10-CM

## 2023-12-18 DIAGNOSIS — J449 Chronic obstructive pulmonary disease, unspecified: Secondary | ICD-10-CM | POA: Insufficient documentation

## 2023-12-18 DIAGNOSIS — Z806 Family history of leukemia: Secondary | ICD-10-CM | POA: Insufficient documentation

## 2023-12-18 DIAGNOSIS — Z791 Long term (current) use of non-steroidal anti-inflammatories (NSAID): Secondary | ICD-10-CM | POA: Diagnosis not present

## 2023-12-18 NOTE — Progress Notes (Signed)
 Nursing interview for a diagnosis of Prostate cancer (HCC) Stage IIC (cT1c, cN0, cM0, PSA: 6, Grade Group: 3).  Patient identity verified x2.   Patient reports very mild urinary weakness. Patient denies all other related issues at this time.  Meaningful use complete.  I-PSS (AUA) score- 9 - Moderate SHIM (ED) score- 20 Urinary Management medication(s) None Urology appointment date- Patient states "APT PENDING." with Dr. Laverle Patter at [facility]  SAFETY ISSUES: Prior radiation? no Pacemaker/ICD? no Is the patient on methotrexate? no  Additional Complaints / other details: None    Vitals- BP (!) 160/85 (BP Location: Left Arm, Patient Position: Sitting, Cuff Size: Large)   Pulse 81   Temp 97.8 F (36.6 C) (Temporal)   Resp 18   Ht 6\' 1"  (1.854 m)   Wt 244 lb 9.6 oz (110.9 kg)   SpO2 97%   BMI 32.27 kg/m   This concludes the interaction.  Ruel Favors, LPN

## 2023-12-19 LAB — PROSTATE-SPECIFIC AG, SERUM (LABCORP): Prostate Specific Ag, Serum: 6.4 ng/mL — ABNORMAL HIGH (ref 0.0–4.0)

## 2023-12-19 NOTE — Progress Notes (Signed)
 Radiation Oncology         [phone] ________________________________  Radiation Oncology Re-Consultation  Name: Adam Hess MRN: [id number]  Date: 12/18/2023  DOB: February 16, 1956  OZ:HYQM, Adam Hess, Gordan Payment, MD   REFERRING PHYSICIAN: Heloise Purpura, MD  DIAGNOSIS: 68 y.o. gentleman with stage T1c adenocarcinoma of the prostate with a Gleason's score of 4+3 and a PSA of 5.96    ICD-10-CM   1. Prostate cancer (HCC)  C61 Prostate-Specific AG, Serum      HISTORY OF PRESENT ILLNESS::Adam Hess is a 68 y.o. gentleman. In summary, he was initially diagnosed with Gleason 3+3 prostate cancer in 5% of one core on 01/19/20 with a PSA of 6.23. He opted for active surveillance at that time. A surveillance prostate MRI in 09/2020 showed a PI-RADS 3 lesion. He proceeded with fusion biopsy on 03/12/21 and was felt to have mild progression to Gleason 3+4, involving only 10% of two cores. PSA at that time was overall stable at 5.73. Following this, his case was presented and he was seen in our multidisciplinary prostate cancer clinic on 04/10/21. At that time, he was leaning towards continued active surveillance. The patient sought out a second opinion on his pathology reading by Trilby Drummer, who felt his pathology showed only Gleason 3+3 disease. Dr. Laverle Patter also obtained an Oncotype DX test on the pathology and his score was 20 out of 100 and indicated a 31% likelihood for adverse pathology if the patient underwent radical prostatectomy. He opted to continue with active surveillance.  More recently, his PSA continued to remain stable at 5.96 in 04/2023 and following further discussion with Dr. Laverle Patter, he ultimately agreed to proceed with surveillance prostate biopsy on 08/01/23. The prostate volume measured 66.3 cc.  Out of 12 core biopsies, only 1 was positive.  The maximum Gleason score was originally read as 4+4 in the right apex but upon further review of the pathology prior to our discussion in  conference this morning, Dr. Maurice March felt that this was actually a Gleason 4+3 due to the minimal pattern four.  He was kindly referred back to the multidisciplinary prostate cancer clinic on 09/26/23 for presentation of pathology and radiology studies in our conference and for discussion of potential radiation treatment options and clinical evaluation.  He remained undecided regarding his treatment preference at that time and ended up seeking a second opinion with the multidisciplinary oncology team at [facility].  He presents today for further discussion and clarification of treatment recommendations.  He is accompanied by his wife, Adam Hess, for today's visit.  PREVIOUS RADIATION THERAPY: No  PAST MEDICAL HISTORY:  has a past medical history of Arthritis, Back pain, Cancer (HCC), COPD (chronic obstructive pulmonary disease) (HCC), Family history of prostate cancer, Hyperlipidemia, and Prostate cancer (HCC).    PAST SURGICAL HISTORY: Past Surgical History:  Procedure Laterality Date   BACK SURGERY  03/16/2008   L-4, L-5   COLONOSCOPY     PROSTATE BIOPSY      FAMILY HISTORY: family history includes Allergies in his mother; Cancer in his mother; Heart disease in his father; Leukemia in his brother; Prostate cancer (age of onset: 76) in his father.  SOCIAL HISTORY:  reports that he quit smoking about 16 years ago. His smoking use included cigarettes. He started smoking about 46 years ago. He has a 30 pack-year smoking history. He has never used smokeless tobacco. He reports that he does not currently use alcohol. He reports that he does not use drugs.  ALLERGIES: Sporanos [itraconazole], Levofloxacin, and Penicillins  MEDICATIONS:  Current Outpatient Medications  Medication Sig Dispense Refill   Ascorbic Acid (VITAMIN C) 1000 MG tablet Take 1,000 mg by mouth 2 (two) times daily.     cholecalciferol (VITAMIN D3) 25 MCG (1000 UNIT) tablet Take 1,000 Units by mouth daily.      Fluticasone-Umeclidin-Vilant (TRELEGY ELLIPTA) 100-62.5-25 MCG/INH AEPB Take 100 mcg by mouth daily.     tadalafil (CIALIS) 5 MG tablet Take 5 mg by mouth daily.     cyclobenzaprine (FLEXERIL) 10 MG tablet SMARTSIG:0.5-1 Tablet(s) By Mouth 2-3 Times Daily PRN (Patient not taking: Reported on 12/18/2023)     gabapentin (NEURONTIN) 600 MG tablet Take 300-600 mg by mouth at bedtime. (Patient not taking: Reported on 12/18/2023)     meloxicam (MOBIC) 15 MG tablet Take 15 mg by mouth daily. (Patient not taking: Reported on 12/18/2023)     Zinc Picolinate POWD Take 100 mg by mouth daily. (Patient not taking: Reported on 12/18/2023)     No current facility-administered medications for this encounter.    REVIEW OF SYSTEMS:  On review of systems, the patient reports that he is doing well overall. He denies any chest pain, shortness of breath, cough, fevers, chills, night sweats, unintended weight changes. He denies any bowel disturbances, and denies abdominal pain, nausea or vomiting. He denies any new musculoskeletal or joint aches or pains. His IPSS was 9, indicating mild to moderate urinary symptoms. His SHIM was 24, indicating he does not have erectile dysfunction. A complete review of systems is obtained and is otherwise negative.   PHYSICAL EXAM:  Wt Readings from Last 3 Encounters:  12/18/23 244 lb 9.6 oz (110.9 kg)  09/26/23 250 lb (113.4 kg)  04/10/21 250 lb 6.4 oz (113.6 kg)   Temp Readings from Last 3 Encounters:  12/18/23 97.8 F (36.6 C) (Temporal)  09/26/23 98 F (36.7 C) (Oral)  04/10/21 97.8 F (36.6 C)   BP Readings from Last 3 Encounters:  12/18/23 (!) 160/85  09/26/23 (!) 147/99  04/10/21 129/76   Pulse Readings from Last 3 Encounters:  12/18/23 81  09/26/23 76  04/10/21 81   Pain Assessment Pain Score: 0-No pain/10  In general this is a well appearing Caucasian man in no acute distress. He's alert and oriented x4 and appropriate throughout the examination. Cardiopulmonary  assessment is negative for acute distress and he exhibits normal effort.    KPS = 100  100 - Normal; no complaints; no evidence of disease. 90   - Able to carry on normal activity; minor signs or symptoms of disease. 80   - Normal activity with effort; some signs or symptoms of disease. 40   - Cares for self; unable to carry on normal activity or to do active work. 60   - Requires occasional assistance, but is able to care for most of his personal needs. 50   - Requires considerable assistance and frequent medical care. 40   - Disabled; requires special care and assistance. 30   - Severely disabled; hospital admission is indicated although death not imminent. 20   - Very sick; hospital admission necessary; active supportive treatment necessary. 10   - Moribund; fatal processes progressing rapidly. 0     - Dead  Karnofsky DA, Abelmann WH, Craver LS and [facility] [phone]) The use of the nitrogen mustards in the palliative treatment of carcinoma: with particular reference to bronchogenic carcinoma Cancer 1 634-56   LABORATORY DATA:  Lab Results  Component  Value Date   WBC 6.1 01/12/2018   HGB 14.9 01/12/2018   HCT 44.0 01/12/2018   MCV 89.5 01/12/2018   PLT 394.0 01/12/2018   Lab Results  Component Value Date   NA 137 05/27/2011   K 4.1 05/27/2011   CL 102 05/27/2011   CO2 28 05/27/2011   Lab Results  Component Value Date   ALT 29 05/27/2011   AST 25 05/27/2011   ALKPHOS 63 05/27/2011   BILITOT 0.2 (L) 05/27/2011     RADIOGRAPHY: No results found.    IMPRESSION/PLAN: 68 y.o. gentleman with Stage T1c adenocarcinoma of the prostate with a Gleason score of 4+3 and a PSA of 5.96.    We discussed the patient's workup and outlined the nature of prostate cancer in this setting. The patient's T stage, Gleason's score, and PSA put him into the unfavorable intermediate risk group which is upgraded from the Gleason 3+4 favorable intermediate risk seen on prior biopsy. Accordingly,  he is eligible for a variety of potential treatment options including prostatectomy, brachytherapy or 5.5 weeks of external radiation +/- ST-ADT. We discussed the available radiation techniques, and focused on the details and logistics of delivery. The patient may not be an ideal candidate for brachytherapy with a prostate volume of 66.3 cc prior to downsizing. We discussed that based on his prostate volume, he would require beginning treatment with a 5 alpha reductase inhibitor and/or ADT for at least 3 months to allow for downsizing of the prostate prior to initiating brachytherapy. We discussed and outlined the risks, benefits, short and long-term effects associated with radiotherapy and compared and contrasted these with prostatectomy. We discussed the role of SpaceOAR gel in reducing the rectal toxicity associated with radiotherapy. We also detailed the role of ADT in the treatment of unfavorable intermediate risk prostate cancer and outlined the associated side effects that could be expected with this therapy.  Given his very low-volume Gleason 4+3 disease, we do not strongly recommend concurrent ADT as a negative impact on quality of life and increased cardiovascular risks likely outweigh the potential small benefit of this treatment. He appears to have a good understanding of his disease and our treatment recommendations which are of curative intent.  He was encouraged to ask questions that were answered to his stated satisfaction.  At the end of the conversation the patient remains undecided regarding his treatment preference but appears to be leaning towards proceeding with a 5.5 week course of daily external beam radiotherapy without ADT.  He did request having a repeat PSA and this was performed today and did not show any significant change, at 6.5. He will take some additional time to consider his options and should he elect to proceed with the daily external beam radiation, we will proceed with  coordinating for fiducial markers and SpaceOAR gel, prior to CT simulation, in anticipation of beginning treatment in the near future.  He has our contact information and will let us know once he reaches a decision so that we can proceed with treatment planning accordingly.  We enjoyed meeting with him and his wife again today and look forward to continuing to participate in his care.  We personally spent 70 minutes in this encounter including chart review, reviewing radiological studies, meeting face-to-face with the patient, entering orders and completing documentation.    Marguarite Arbour, PA-C    Margaretmary Dys, MD  [facility]  Radiation Oncology Direct Dial: [phone]  Fax: [phone] [email or web address]  Skype  [email or web address]

## 2023-12-19 NOTE — Progress Notes (Signed)
 RN spoke with patient and he is aware of PSA results.    Patient would like to proceed with 5.5 weeks of daily radiation with no ADT.  RN reviewed next steps with fiducial's, spaceOAR, and CT Simulation.    Plan of care in progress.

## 2023-12-23 ENCOUNTER — Other Ambulatory Visit: Payer: Self-pay | Admitting: Urology

## 2023-12-26 DIAGNOSIS — J209 Acute bronchitis, unspecified: Secondary | ICD-10-CM | POA: Diagnosis not present

## 2023-12-26 DIAGNOSIS — R059 Cough, unspecified: Secondary | ICD-10-CM | POA: Diagnosis not present

## 2023-12-26 DIAGNOSIS — J029 Acute pharyngitis, unspecified: Secondary | ICD-10-CM | POA: Diagnosis not present

## 2024-01-08 DIAGNOSIS — C61 Malignant neoplasm of prostate: Secondary | ICD-10-CM | POA: Diagnosis not present

## 2024-01-08 NOTE — Progress Notes (Signed)
 Subjective Patient ID: Adam Hess is a 68 y.o. male.  HPI  68 year old male with a history of a T1c, Gleason 6 prostate cancer PSA 6.23 (10/27/2019). At that time he had 2 positive cores. Active surveillance recommended.   PSA PSA was 5.73 (06/07/2021. Repeat biopsy again revealed a Gleason 6 cancer. PSA was 5.96 on 04/30/2023. As part of active surveillance underwent another biopsy on 08/01/2023.   MSK review revealed a Gleason 7 cancer (4+3) RM, (downgraded from Gleason 8 (4+4) on the outside) and Gleason 6 RML.   PSMA PET scan 08/25/2023 ([facility] read) revealed a moderate focus of uptake right anterior prostate. The patient has some mild lower urinary tract symptoms. On tadalafil for ED. Unfavorable intermediate risk prostate cancer.   Patient is here to discuss treatment options and advice.   The following portions of the patient's history were reviewed and updated as appropriate: allergies, current medications, past family history, past medical history, past social history, past surgical history, and problem list.  ROS Review of Systems  Constitutional:  Negative for activity change, diaphoresis and unexpected weight change.  HENT:  Negative for congestion, ear discharge, ear pain, nosebleeds, rhinorrhea, sore throat and voice change.   Eyes:  Negative for pain, discharge, redness, itching and visual disturbance.  Respiratory:  Negative for apnea, choking, chest tightness and wheezing.   Cardiovascular:  Negative for chest pain, palpitations and leg swelling.  Gastrointestinal:  Negative for abdominal distention, anal bleeding, blood in stool and vomiting.  Endocrine: Negative for cold intolerance, heat intolerance, polydipsia and polyphagia.  Musculoskeletal:  Negative for arthralgias, gait problem, joint swelling, neck pain and neck stiffness.  Skin:  Negative for color change, pallor and rash.  Allergic/Immunologic: Negative for environmental allergies, food allergies and  immunocompromised state.  Neurological:  Negative for dizziness, seizures, facial asymmetry, speech difficulty, numbness and headaches.  Hematological:  Negative for adenopathy. Does not bruise/bleed easily.  Psychiatric/Behavioral:  Negative for behavioral problems, confusion, hallucinations, self-injury and suicidal ideas.        Allergies  Allergen Reactions  . Itraconazole Anaphylaxis  . Penicillin Other (See Comments)    As child  . Levofloxacin Rash   Current Outpatient Medications  Medication Sig Dispense Refill  . ascorbic acid (VITAMIN C) 1,000 mg tablet Take 1,000 mg by mouth Once Daily.    SABRA ascorbic acid (VITAMIN C) 1,000 mg tablet Take 1 tablet by mouth Once Daily.    SABRA aspirin 81 mg EC tablet Take  by mouth Once Daily.    SABRA azelastine-fluticasone (DYMISTA) 137-50 mcg/spray spry nasal spray 137 sprays.    SABRA azithromycin (ZITHROMAX) 250 mg tablet Take two tablets on the first day and then one tablet every day after. 6 tablet 0  . dextromethorphan (DELSYM) 30 mg/5 mL su12 12 hour suspension Take 60 mg by mouth 2 (two) times a day. 89 mL 0  . diclofenac (VOLTAREN) 75 mg EC tablet Take 75 mg by mouth 2 (two) times a day.    . esomeprazole (NexIUM) 40 mg DR capsule 20 mg daily before breakfast.    . fluticasone propionate (FLONASE) 50 mcg/spray nasal spray 1 spray daily.    . fluticasone-umeclidinium-vilanterol (Trelegy Ellipta) 100-62.5-25 mcg inhaler Inhale daily.    SABRA ibuprofen (MOTRIN) 200 mg tablet Take 200 mg by mouth every 6 (six) hours as needed.    . niacin (NIASPAN) 500 mg CR tablet Take 500 mg by mouth Once Daily.    SABRA nystatin (MYCOSTATIN) 100,000 unit/mL suspension Gargle and spit  5 ml four times daily for 10 days. 200 mL 0  . tadalafiL (Cialis) 5 mg tablet Take 5 mg by mouth daily as needed (erectile dysfunction).    SABRA tiZANidine (ZANAFLEX) 4 mg capsule Take 4 mg by mouth 3 (three) times a day.    . turmeric, bulk, 100 % powd by miscellaneous route Once Daily.      No current facility-administered medications for this visit.   Past Medical History:  Diagnosis Date  . Allergy    . Asthma (CMD)   . BPH (benign prostatic hyperplasia)   . High cholesterol    Past Surgical History:  Procedure Laterality Date  . BACK SURGERY     Procedure: BACK SURGERY; DISCECTOMY L 4-5  . COLONOSCOPY     Procedure: COLONOSCOPY  . ENDOSCOPY     Procedure: ENDOSCOPY   No family history on file.  Social History:  reports that he has quit smoking. He has never used smokeless tobacco. He reports that he does not currently use alcohol. He reports that he does not use drugs.   Lab Results   CBC (last 30 days) No results found for: WBC, RBC, HGB, HCT, MCV, MCH, MCHC, RDW, PLT, MPV  CMP (last 30 days) No results found for: NA, K, CL, CO2, BUN, GLUCOSE, CREATININE, CALCIUM, PROT, ALBUMIN, BILITOT, ALP, AST, ALT, ANIONGAP, EGFR  Creatinine (last 30 days) No results found for: CREATININE  Urinalysis with Microscopic (last 30 days) Lab Results  Component Value Date   COLORU Yellow 01/08/2024   CLARITYU Clear 01/08/2024   SPECGRAV 1.025 01/08/2024   PHUR 6.0 01/08/2024   PROTEINUA Negative 01/08/2024   KETONEU Negative 01/08/2024   BILIRUBINUR Negative 01/08/2024   BLOODU Negative 01/08/2024   UROBILINOGEN 0.2 01/08/2024   NITRITE Negative 01/08/2024   LEUKOUA Negative 01/08/2024    Urine Culture (last 30 days) No results found for: URINECX  PSA - last 5 years No results found for: PSAFREE, PSA   Objective Physical Exam Constitutional:      Appearance: Normal appearance. He is normal weight.  HENT:     Head: Normocephalic and atraumatic.     Nose: Nose normal.     Mouth/Throat:     Mouth: Mucous membranes are moist.     Pharynx: Oropharynx is clear.  Eyes:     Extraocular Movements: Extraocular movements intact.     Conjunctiva/sclera: Conjunctivae normal.     Pupils: Pupils are  equal, round, and reactive to light.  Cardiovascular:     Rate and Rhythm: Normal rate and regular rhythm.  Pulmonary:     Effort: Pulmonary effort is normal.     Breath sounds: Normal breath sounds.  Abdominal:     General: Abdomen is flat. Bowel sounds are normal.     Palpations: Abdomen is soft.  Genitourinary:    Penis: Normal.      Testes: Normal.  Musculoskeletal:        General: Normal range of motion.     Cervical back: Normal range of motion and neck supple.  Skin:    General: Skin is warm and dry.     Capillary Refill: Capillary refill takes less than 2 seconds.  Neurological:     General: No focal deficit present.     Mental Status: He is alert and oriented to person, place, and time. Mental status is at baseline.  Psychiatric:        Mood and Affect: Mood normal.        Behavior: Behavior  normal.        Thought Content: Thought content normal.        Judgment: Judgment normal.         Assessment: 1. Prostate cancer    (CMD)    Patient has an unfavorable intermediate risk prostate cancer per NCCN guidelines. The patient has been seen by multiple urologist, radiation oncologist and has seen also review of his pathology. We have discussed and advised that he has at least a Gleason 4+3 = 7 prostate cancer. He is young and healthy. He has a survival of more than 15 years. For this reason, we think he should decide for therapy. We have discussed radical prostatectomy, radiation therapy with or without ADT for 6 months, versus focal therapy. He would like to undergo radiation and does not want surgery. He does not want to have the risk erectile dysfunction basically. He also does not want ADT with radiation due to adverse effects of the biochemical castration. He has decided for radiation therapy and we have agreed on this. He already has appointment with radiation oncologist.  I spent 45 minutes of none face to face and face to face evaluation with the patient, over half in  discussion of the diagnosis and the importance of compliance with the treatment plan as well as counselling the patient and coordinating follow-up.   Plan  Plan: FU PRN     Electronically signed by: Tita Vicki Stallion, MD 01/08/2024 2:19 PM

## 2024-01-14 ENCOUNTER — Other Ambulatory Visit: Payer: Self-pay | Admitting: Urology

## 2024-01-14 DIAGNOSIS — C61 Malignant neoplasm of prostate: Secondary | ICD-10-CM

## 2024-01-15 DIAGNOSIS — M5441 Lumbago with sciatica, right side: Secondary | ICD-10-CM | POA: Diagnosis not present

## 2024-01-29 ENCOUNTER — Other Ambulatory Visit: Payer: Self-pay | Admitting: Urology

## 2024-01-29 MED ORDER — LORAZEPAM 1 MG PO TABS: 1.0000 mg | ORAL_TABLET | ORAL | 0 refills | Status: DC | PRN

## 2024-02-03 ENCOUNTER — Telehealth: Payer: Self-pay | Admitting: *Deleted

## 2024-02-03 NOTE — Telephone Encounter (Signed)
 CALLED PATIENT TO INFORM THAT HE DOESN'T NEED MRI TOMORROW, BUT JULY 15, SPOKE WITH PATIENT AND HE IS AWARE OF THIS

## 2024-02-04 ENCOUNTER — Other Ambulatory Visit: Payer: Self-pay | Admitting: Urology

## 2024-02-04 ENCOUNTER — Ambulatory Visit (HOSPITAL_COMMUNITY)

## 2024-02-04 MED ORDER — DIAZEPAM 5 MG PO TABS: 5.0000 mg | ORAL_TABLET | ORAL | 0 refills | Status: AC | PRN

## 2024-02-06 DIAGNOSIS — M5416 Radiculopathy, lumbar region: Secondary | ICD-10-CM | POA: Diagnosis not present

## 2024-02-06 DIAGNOSIS — M533 Sacrococcygeal disorders, not elsewhere classified: Secondary | ICD-10-CM | POA: Diagnosis not present

## 2024-02-20 ENCOUNTER — Other Ambulatory Visit: Payer: Self-pay | Admitting: Urology

## 2024-02-26 DIAGNOSIS — M5416 Radiculopathy, lumbar region: Secondary | ICD-10-CM | POA: Diagnosis not present

## 2024-02-26 DIAGNOSIS — C61 Malignant neoplasm of prostate: Secondary | ICD-10-CM | POA: Diagnosis not present

## 2024-03-17 ENCOUNTER — Encounter (HOSPITAL_COMMUNITY): Payer: Self-pay | Admitting: Urology

## 2024-03-17 NOTE — Progress Notes (Signed)
 Spoke w/ via phone for pre-op interview--- [facility] needs dos----  NONE       Lab results------ COVID test -----patient states asymptomatic no test needed Arrive at -------0830 NPO after MN NO Solid Food.  Clear liquids from MN until---0730 Pre-Surgery Ensure or G2:  Med rec completed Medications to take morning of surgery -----Trellegy Ellipta. Bring Albuterol inhaler. Diabetic medication -----  GLP1 agonist last dose: GLP1 instructions:  Patient instructed no nail polish to be worn day of surgery Patient instructed to bring photo id and insurance card day of surgery Patient aware to have Driver (ride ) / caregiver    for 24 hours after surgery - Wife Adam Hess Patient Special Instructions ----- Shower with antibacterial soap. Fleet enema per surgeons instructions. Pre-Op special Instructions -----  Patient verbalized understanding of instructions that were given at this phone interview. Patient denies chest pain, sob, fever, cough at the interview.

## 2024-03-23 DIAGNOSIS — M25562 Pain in left knee: Secondary | ICD-10-CM | POA: Diagnosis not present

## 2024-03-23 DIAGNOSIS — M25461 Effusion, right knee: Secondary | ICD-10-CM | POA: Diagnosis not present

## 2024-03-26 ENCOUNTER — Ambulatory Visit (HOSPITAL_COMMUNITY): Admitting: Registered Nurse

## 2024-03-26 ENCOUNTER — Encounter (HOSPITAL_COMMUNITY): Payer: Self-pay | Admitting: Urology

## 2024-03-26 ENCOUNTER — Ambulatory Visit (HOSPITAL_BASED_OUTPATIENT_CLINIC_OR_DEPARTMENT_OTHER): Admitting: Registered Nurse

## 2024-03-26 ENCOUNTER — Encounter (HOSPITAL_COMMUNITY)
Admission: RE | Disposition: A | Discharge status: Home / Self Care | Payer: Self-pay | Source: Home / Self Care | Attending: Urology

## 2024-03-26 ENCOUNTER — Ambulatory Visit (HOSPITAL_COMMUNITY)
Admission: RE | Admit: 2024-03-26 | Discharge: 2024-03-26 | Disposition: A | Discharge status: Home / Self Care | Attending: Urology | Admitting: Urology

## 2024-03-26 ENCOUNTER — Other Ambulatory Visit: Payer: Self-pay

## 2024-03-26 ENCOUNTER — Telehealth: Payer: Self-pay | Admitting: *Deleted

## 2024-03-26 DIAGNOSIS — J449 Chronic obstructive pulmonary disease, unspecified: Secondary | ICD-10-CM | POA: Diagnosis not present

## 2024-03-26 DIAGNOSIS — C61 Malignant neoplasm of prostate: Secondary | ICD-10-CM | POA: Insufficient documentation

## 2024-03-26 DIAGNOSIS — Z8042 Family history of malignant neoplasm of prostate: Secondary | ICD-10-CM | POA: Diagnosis not present

## 2024-03-26 DIAGNOSIS — Z87891 Personal history of nicotine dependence: Secondary | ICD-10-CM | POA: Diagnosis not present

## 2024-03-26 HISTORY — PX: GOLD SEED IMPLANT: SHX6343

## 2024-03-26 HISTORY — PX: SPACE OAR INSTILLATION: SHX6769

## 2024-03-26 SURGERY — INSERTION, GOLD SEEDS
Anesthesia: Monitor Anesthesia Care | Site: Prostate

## 2024-03-26 MED ORDER — ORAL CARE MOUTH RINSE: 15.0000 mL | Freq: Once | OROMUCOSAL | Status: AC

## 2024-03-26 MED ORDER — CHLORHEXIDINE GLUCONATE 0.12 % MT SOLN
15.0000 mL | Freq: Once | OROMUCOSAL | Status: AC
  Administered 2024-03-26: 15 mL via OROMUCOSAL

## 2024-03-26 MED ORDER — CEFAZOLIN SODIUM-DEXTROSE 2-4 GM/100ML-% IV SOLN
2.0000 g | INTRAVENOUS | Status: AC
  Administered 2024-03-26: 2 g via INTRAVENOUS

## 2024-03-26 MED ORDER — SODIUM CHLORIDE 0.9 % IV SOLN: INTRAVENOUS | Status: DC

## 2024-03-26 MED ORDER — FENTANYL CITRATE (PF) 250 MCG/5ML IJ SOLN
INTRAMUSCULAR | Status: AC
  Filled 2024-03-26: qty 5

## 2024-03-26 MED ORDER — LIDOCAINE HCL 2 % IJ SOLN
INTRAMUSCULAR | Status: AC
  Filled 2024-03-26: qty 20

## 2024-03-26 MED ORDER — DEXMEDETOMIDINE HCL IN NACL 80 MCG/20ML IV SOLN
INTRAVENOUS | Status: DC | PRN
Start: 2024-03-26 — End: 2024-03-26
  Administered 2024-03-26: 12 ug via INTRAVENOUS

## 2024-03-26 MED ORDER — PROPOFOL 10 MG/ML IV BOLUS
INTRAVENOUS | Status: DC | PRN
  Administered 2024-03-26: 50 mg via INTRAVENOUS

## 2024-03-26 MED ORDER — LACTATED RINGERS IV SOLN: INTRAVENOUS | Status: DC

## 2024-03-26 MED ORDER — CEFAZOLIN SODIUM-DEXTROSE 2-4 GM/100ML-% IV SOLN
INTRAVENOUS | Status: AC
  Filled 2024-03-26: qty 100

## 2024-03-26 MED ORDER — LIDOCAINE HCL 2 % IJ SOLN
INTRAMUSCULAR | Status: DC | PRN
  Administered 2024-03-26: 10 mL

## 2024-03-26 MED ORDER — FENTANYL CITRATE (PF) 250 MCG/5ML IJ SOLN
INTRAMUSCULAR | Status: DC | PRN
  Administered 2024-03-26: 50 ug via INTRAVENOUS

## 2024-03-26 MED ORDER — PROPOFOL 500 MG/50ML IV EMUL
INTRAVENOUS | Status: DC | PRN
  Administered 2024-03-26: 120 ug/kg/min via INTRAVENOUS

## 2024-03-26 MED ORDER — CHLORHEXIDINE GLUCONATE 0.12 % MT SOLN
OROMUCOSAL | Status: AC
  Filled 2024-03-26: qty 15

## 2024-03-26 MED ORDER — SODIUM CHLORIDE (PF) 0.9 % IJ SOLN
INTRAMUSCULAR | Status: DC | PRN
  Administered 2024-03-26: 10 mL via INTRAVENOUS

## 2024-03-26 MED ORDER — SODIUM CHLORIDE (PF) 0.9 % IJ SOLN
INTRAMUSCULAR | Status: AC
  Filled 2024-03-26: qty 10

## 2024-03-26 SURGICAL SUPPLY — 23 items
BLADE CLIPPER SENSICLIP SURGIC (BLADE) ×1 IMPLANT
CNTNR URN SCR LID CUP LEK RST (MISCELLANEOUS) ×1 IMPLANT
COVER BACK TABLE 60X90IN (DRAPES) ×1 IMPLANT
DRSG IV TEGADERM 3.5X4.5 STRL (GAUZE/BANDAGES/DRESSINGS) IMPLANT
DRSG TEGADERM 4X4.75 (GAUZE/BANDAGES/DRESSINGS) IMPLANT
DRSG TEGADERM 8X12 (GAUZE/BANDAGES/DRESSINGS) ×1 IMPLANT
GAUZE SPONGE 4X4 12PLY STRL (GAUZE/BANDAGES/DRESSINGS) IMPLANT
GAUZE SPONGE 4X4 12PLY STRL LF (GAUZE/BANDAGES/DRESSINGS) IMPLANT
GLOVE BIO SURGEON STRL SZ7.5 (GLOVE) ×1 IMPLANT
GLOVE SURG ORTHO 8.5 STRL (GLOVE) ×1 IMPLANT
IMPL SPACEOAR SYSTEM 10ML (Spacer) ×1 IMPLANT
MARKER GOLD PRELOAD 1.2X3 (Urological Implant) ×1 IMPLANT
MARKER SKIN DUAL TIP RULER LAB (MISCELLANEOUS) ×1 IMPLANT
NDL SPNL 22GX3.5 QUINCKE BK (NEEDLE) ×1 IMPLANT
NEEDLE SPNL 22GX3.5 QUINCKE BK (NEEDLE) ×1 IMPLANT
SHEATH ULTRASOUND LF (SHEATH) IMPLANT
SHEATH ULTRASOUND LTX NONSTRL (SHEATH) IMPLANT
SLEEVE SCD COMPRESS KNEE MED (STOCKING) ×1 IMPLANT
SURGILUBE 2OZ TUBE FLIPTOP (MISCELLANEOUS) ×1 IMPLANT
SYR 10ML LL (SYRINGE) IMPLANT
SYR CONTROL 10ML LL (SYRINGE) ×1 IMPLANT
TOWEL OR 17X24 6PK STRL BLUE (TOWEL DISPOSABLE) ×1 IMPLANT
UNDERPAD 30X36 HEAVY ABSORB (UNDERPADS AND DIAPERS) ×1 IMPLANT

## 2024-03-26 NOTE — Telephone Encounter (Signed)
 CALLED PATIENT TO REMIND OF SIM AND MRI FOR 03-30-24, ARRIVAL TIME FOR SIM - 03-30-24 - 9:45 AM @ [facility], INFORMED PATIENT TO ARRIVE WITH A FULL BLADDER, INFORMED PATIENT TO ARRIVE FOR MRI ON 03-30-24 @ 11:30 AM @ [facility], SPOKE WITH PATIENT AND HE IS AWARE OF THESE APPTS. AND THE INSTRUCTIONS

## 2024-03-26 NOTE — Anesthesia Preprocedure Evaluation (Signed)
 Anesthesia Evaluation  Patient identified by MRN, date of birth, ID band Patient awake    Reviewed: Allergy  & Precautions, NPO status , Patient's Chart, lab work & pertinent test results  History of Anesthesia Complications Negative for: history of anesthetic complications  Airway Mallampati: III  TM Distance: >3 FB Neck ROM: Full    Dental no notable dental hx. (+) Teeth Intact   Pulmonary neg pulmonary ROS, neg sleep apnea, neg COPD, Patient abstained from smoking.Not current smoker, former smoker   Pulmonary exam normal breath sounds clear to auscultation       Cardiovascular Exercise Tolerance: Good METS(-) hypertension(-) CAD and (-) Past MI negative cardio ROS (-) dysrhythmias  Rhythm:Regular Rate:Normal - Systolic murmurs    Neuro/Psych negative neurological ROS  negative psych ROS   GI/Hepatic ,neg GERD  ,,(+)     (-) substance abuse    Endo/Other  neg diabetes    Renal/GU negative Renal ROS     Musculoskeletal   Abdominal   Peds  Hematology   Anesthesia Other Findings Past Medical History: No date: Arthritis No date: Back pain No date: Cancer (HCC)     Comment:  prostate No date: COPD (chronic obstructive pulmonary disease) (HCC) No date: Family history of prostate cancer No date: Hyperlipidemia No date: Prostate cancer (HCC)  Reproductive/Obstetrics                              Anesthesia Physical Anesthesia Plan  ASA: 2  Anesthesia Plan: General   Post-op Pain Management: Minimal or no pain anticipated   Induction: Intravenous  PONV Risk Score and Plan: 2 and Propofol  infusion, TIVA and Ondansetron  Airway Management Planned: Nasal Cannula  Additional Equipment: None  Intra-op Plan:   Post-operative Plan:   Informed Consent: I have reviewed the patients History and Physical, chart, labs and discussed the procedure including the risks, benefits and  alternatives for the proposed anesthesia with the patient or authorized representative who has indicated his/her understanding and acceptance.     Dental advisory given  Plan Discussed with: CRNA and Surgeon  Anesthesia Plan Comments: (Discussed risks of anesthesia with patient, including possibility of difficulty with spontaneous ventilation under anesthesia necessitating airway intervention, PONV, and rare risks such as cardiac or respiratory or neurological events, and allergic reactions. Discussed the role of CRNA in patient's perioperative care. Patient understands.)        Anesthesia Quick Evaluation

## 2024-03-26 NOTE — Discharge Instructions (Signed)
1 - You may have urinary urgency (bladder spasms) and bloody urine on / off for up to 2 weeks.  This is normal.  2 - Call MD or go to ER for fever >102, severe pain / nausea / vomiting not relieved by medications, or acute change in medical status  

## 2024-03-26 NOTE — Brief Op Note (Signed)
 03/26/2024  10:00 AM  PATIENT:  Adam Hess  68 y.o. male  PRE-OPERATIVE DIAGNOSIS:  PROSTATE CANCER  POST-OPERATIVE DIAGNOSIS:  PROSTATE CANCER  PROCEDURE:  Procedure(s): INSERTION, GOLD SEEDS (N/A) INJECTION, HYDROGEL SPACER (N/A)  SURGEON:  Surgeons and Role:    * Manny, Ricardo KATHEE Raddle., MD - Primary  PHYSICIAN ASSISTANT:   ASSISTANTS: none   ANESTHESIA:   MAC  EBL:  minimal   BLOOD ADMINISTERED:none  DRAINS: none   LOCAL MEDICATIONS USED:  NONE  SPECIMEN:  No Specimen  DISPOSITION OF SPECIMEN:  N/A  COUNTS:  YES  TOURNIQUET:  * No tourniquets in log *  DICTATION: .Other Dictation: Dictation Number [id number]  PLAN OF CARE: Discharge to home after PACU  PATIENT DISPOSITION:  PACU - hemodynamically stable.   Delay start of Pharmacological VTE agent (>24hrs) due to surgical blood loss or risk of bleeding: not applicable

## 2024-03-26 NOTE — H&P (Signed)
 Adam Hess is an 68 y.o. male.    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 TRUS 60gm. PSA 5.96, PET localized, Elects primary radiation.   Today Adam Hess is seen to proceed with prostate fiducial marker and SPACE-OAR. Originally from [place], wife Nathanel very involved.   Past Medical History:  Diagnosis Date   Arthritis    Back pain    Cancer (HCC)    prostate   COPD (chronic obstructive pulmonary disease) (HCC)    Family history of prostate cancer    Hyperlipidemia    Prostate cancer [facility])     Past Surgical History:  Procedure Laterality Date   BACK SURGERY  03/16/2008   L-4, L-5   COLONOSCOPY     PROSTATE BIOPSY      Family History  Problem Relation Age of Onset   Cancer Mother        ? type   Allergies Mother    Heart disease Father    Prostate cancer Father 26   Leukemia Brother    Social History:  reports that he quit smoking about 16 years ago. His smoking use included cigarettes. He started smoking about 46 years ago. He has a 30 pack-year smoking history. He has never used smokeless tobacco. He reports that he does not currently use alcohol. He reports that he does not use drugs.  Allergies:  Allergies  Allergen Reactions   Sporanos [Itraconazole] Shortness Of Breath        Levofloxacin Rash    Reports rash and breathing difficulty.   Penicillins Rash    Reports rash and breathing difficulty    No medications prior to admission.    No results found for this or any previous visit (from the past 48 hours). No results found.  Review of Systems  Constitutional:  Negative for chills and fever.  All other systems reviewed and are negative.   Height 6' 1 (1.854 m), weight 107.5 kg. Physical Exam Vitals reviewed.  Constitutional:      Comments: Very pleasant, wife at bedside.   HENT:     Head: Normocephalic.     Nose: Nose  normal.  Eyes:     Pupils: Pupils are equal, round, and reactive to light.  Cardiovascular:     Rate and Rhythm: Normal rate.  Pulmonary:     Effort: Pulmonary effort is normal.  Abdominal:     General: Abdomen is flat.  Genitourinary:    Comments: No CVAT Musculoskeletal:        General: Normal range of motion.     Cervical back: Normal range of motion.  Skin:    General: Skin is warm.  Neurological:     General: No focal deficit present.     Mental Status: He is alert.  Psychiatric:        Mood and Affect: Mood normal.      Assessment/Plan  Proceed as planned with prostate fiducial marker and SPACE-OAR. Risks, benefits, peri-op course discussed.   Ricardo KATHEE Alvaro Mickey., MD 03/26/2024, 7:09 AM

## 2024-03-26 NOTE — Anesthesia Postprocedure Evaluation (Signed)
 Anesthesia Post Note  Patient: Araceli Coufal  Procedure(s) Performed: INSERTION, GOLD SEEDS (Prostate) INJECTION, HYDROGEL SPACER (Prostate)     Patient location during evaluation: PACU Anesthesia Type: MAC Level of consciousness: awake and alert Pain management: pain level controlled Vital Signs Assessment: post-procedure vital signs reviewed and stable Respiratory status: spontaneous breathing, nonlabored ventilation, respiratory function stable and patient connected to nasal cannula oxygen Cardiovascular status: stable and blood pressure returned to baseline Postop Assessment: no apparent nausea or vomiting Anesthetic complications: no   No notable events documented.  Last Vitals:  Vitals:   03/26/24 0901 03/26/24 1015  BP: (!) 146/80 (!) 142/83  Pulse: (!) 51 (!) 55  Resp: 18 15  Temp: 36.6 C 36.6 C  SpO2: 96% 92%    Last Pain:  Vitals:   03/26/24 0901  TempSrc: Oral  PainSc: 0-No pain                 Rome Ade

## 2024-03-26 NOTE — Transfer of Care (Signed)
 Immediate Anesthesia Transfer of Care Note  Patient: Adam Hess  Procedure(s) Performed: INSERTION, GOLD SEEDS (Prostate) INJECTION, HYDROGEL SPACER (Prostate)  Patient Location: PACU  Anesthesia Type:MAC  Level of Consciousness: awake  Airway & Oxygen Therapy: Patient Spontanous Breathing  Post-op Assessment: Report given to RN and Post -op Vital signs reviewed and stable  Post vital signs: Reviewed and stable  Last Vitals:  Vitals Value Taken Time  BP 144/83 03/26/24 10:05  Temp    Pulse 54 03/26/24 10:13  Resp 8 03/26/24 10:13  SpO2 92 % 03/26/24 10:13  Vitals shown include unfiled device data.  Last Pain:  Vitals:   03/26/24 0901  TempSrc: Oral  PainSc: 0-No pain      Patients Stated Pain Goal: 7 (03/26/24 0901)  Complications: No notable events documented.

## 2024-03-26 NOTE — Op Note (Signed)
 [name], [name] MEDICAL RECORD NO: [id number] ACCOUNT NO: [id number] DATE OF BIRTH: 1955/11/17 FACILITY: [facility] LOCATION: [facility] PHYSICIAN: Ricardo Likens, MD  Operative Report   DATE OF PROCEDURE: 03/26/2024  PREOPERATIVE DIAGNOSIS: High-risk prostate cancer.  PROCEDURES PERFORMED: 1.  Prostate fiducial marker placement. 2.  SpaceOAR placement with ultrasound guidance.  ESTIMATED BLOOD LOSS:  None.  DRAINS:  None.  COMPLICATIONS:  None.   INDICATIONS: The patient is a pleasant 68 year old man who is found to have a small volume but a high risk of adenocarcinoma of the prostate.  After consideration of management options, he elects curative intent path with primary radiation. As part of this, he presents  today for prostate fiducial marker placement and SpaceOAR.  Informed consent was obtained and placed in the medical record.  DESCRIPTION OF PROCEDURE:  The patient being Adam Hess verified and the procedure being prostate fiducial marker placement and SpaceOAR was confirmed.  Procedure timeout was performed.  Intravenous antibiotics administered.  Monitored anesthesia  care, conscious sedation delivered.  The patient was placed in a medium lithotomy position.  Sterile field was created by placing a large Tegaderm over his scrotum and penis, taking them out of the way.  The perineum was copiously irrigated and prepped  using Iodine.   Transrectal ultrasound probe was introduced which revealed an unremarkable prostrate and intact prerectal tissue planes.  10 mL of 2% lidocaine  were instilled, transperineally from the skin to the prostate apex bilaterally.  Next,  fiducial markers were placed transperineally in the right base mid, right apical mid, and left bilateral positions respectively, under ultrasound guidance.  The probe was taken off the anterior tension to further develop the anterior rectal space and   using transperineal placement of the introducer needle.  This was  placed into the anterior perirectal space mid gland.  It was corroborated by 2 mL of normal saline hydrodissection.  Finally, 10 mL of the gel matrix was instilled over 10 seconds  according to manufacturer guidelines. This resulted in excellent posterior displacement of the anterior rectal wall.  Ultrasound was removed and digital rectal exam revealed excellent SpaceOAR effect without any obvious gel intraluminally or within the  palpable wall.  The procedure was terminated.  The patient tolerated the procedure well.  There were no immediate periprocedural complications.  The patient was taken to post anesthesia care unit in stable condition with plan for discharge to home.   MUK D: 03/26/2024 10:04:36 am T: 03/26/2024 10:31:00 am  JOB: [id number]/ [id number]

## 2024-03-29 ENCOUNTER — Encounter (HOSPITAL_COMMUNITY): Payer: Self-pay | Admitting: Urology

## 2024-03-29 ENCOUNTER — Telehealth: Payer: Self-pay | Admitting: *Deleted

## 2024-03-29 DIAGNOSIS — M5416 Radiculopathy, lumbar region: Secondary | ICD-10-CM | POA: Diagnosis not present

## 2024-03-29 NOTE — Telephone Encounter (Signed)
 Called patient to inform that it would be ok to take med for MRI tomorrow, that PA Ashlyn Bruning prescribed for him in May, spoke with patient and he verified understanding this

## 2024-03-30 ENCOUNTER — Ambulatory Visit (HOSPITAL_COMMUNITY)
Admission: RE | Admit: 2024-03-30 | Discharge: 2024-03-30 | Disposition: A | Discharge status: Home / Self Care | Source: Ambulatory Visit | Attending: Urology | Admitting: Urology

## 2024-03-30 ENCOUNTER — Ambulatory Visit
Admission: RE | Admit: 2024-03-30 | Discharge: 2024-03-30 | Disposition: A | Discharge status: Home / Self Care | Source: Ambulatory Visit | Attending: Radiation Oncology | Admitting: Radiation Oncology

## 2024-03-30 DIAGNOSIS — C61 Malignant neoplasm of prostate: Secondary | ICD-10-CM

## 2024-03-30 NOTE — Progress Notes (Signed)
  Radiation Oncology         [phone] ________________________________  Name: Adam Hess MRN: [id number]  Date: 03/30/2024  DOB: January 02, 1956  SIMULATION AND TREATMENT PLANNING NOTE    ICD-10-CM   1. Prostate cancer [facility])  C61       DIAGNOSIS:   68 y.o. gentleman with stage T1c adenocarcinoma of the prostate with a Gleason's score of 4+3 and a PSA of 5.96  NARRATIVE:  The patient was brought to the CT Simulation planning suite.  Identity was confirmed.  All relevant records and images related to the planned course of therapy were reviewed.  The patient freely provided informed written consent to proceed with treatment after reviewing the details related to the planned course of therapy. The consent form was witnessed and verified by the simulation staff.  Then, the patient was set-up in a stable reproducible supine position for radiation therapy.  A vacuum lock pillow device was custom fabricated to position his legs in a reproducible immobilized position.  Then, supervised the performance of a urethrogram under sterile conditions to identify the prostatic apex.  CT images were obtained.  Surface markings were placed.  The CT images were loaded into the planning software.  Then the prostate target and avoidance structures including the rectum, bladder, bowel and hips were contoured.  Treatment planning then occurred.  The radiation prescription was entered and confirmed.  A total of one complex treatment devices was fabricated. I have requested : Intensity Modulated Radiotherapy (IMRT) is medically necessary for this case for the following reason:  Rectal sparing.  I have requested daily cone beam CT volumetric image gudiance to track gold fiducial posiitoning along with bladder and rectal filling, this is medically necessary to assure accurate positioning of high dose radiation.  PLAN:  The patient will receive 70 Gy in 28 fractions.  ________________________________  Donnice FELIX Patrcia,  M.D.

## 2024-04-02 DIAGNOSIS — C61 Malignant neoplasm of prostate: Secondary | ICD-10-CM | POA: Diagnosis not present

## 2024-04-13 ENCOUNTER — Other Ambulatory Visit: Payer: Self-pay

## 2024-04-13 ENCOUNTER — Ambulatory Visit
Admission: RE | Admit: 2024-04-13 | Discharge: 2024-04-13 | Disposition: A | Discharge status: Home / Self Care | Source: Ambulatory Visit | Attending: Radiation Oncology | Admitting: Radiation Oncology

## 2024-04-13 DIAGNOSIS — C61 Malignant neoplasm of prostate: Secondary | ICD-10-CM | POA: Diagnosis not present

## 2024-04-13 LAB — RAD ONC ARIA SESSION SUMMARY
Course Elapsed Days: 0
Plan Fractions Treated to Date: 1
Plan Prescribed Dose Per Fraction: 2.5 Gy
Plan Total Fractions Prescribed: 28
Plan Total Prescribed Dose: 70 Gy
Reference Point Dosage Given to Date: 2.5 Gy
Reference Point Session Dosage Given: 2.5 Gy
Session Number: 1

## 2024-04-14 ENCOUNTER — Ambulatory Visit
Admission: RE | Admit: 2024-04-14 | Discharge: 2024-04-14 | Disposition: A | Discharge status: Home / Self Care | Source: Ambulatory Visit | Attending: Radiation Oncology

## 2024-04-14 ENCOUNTER — Other Ambulatory Visit: Payer: Self-pay

## 2024-04-14 DIAGNOSIS — C61 Malignant neoplasm of prostate: Secondary | ICD-10-CM | POA: Diagnosis not present

## 2024-04-14 LAB — RAD ONC ARIA SESSION SUMMARY
Course Elapsed Days: 1
Plan Fractions Treated to Date: 2
Plan Prescribed Dose Per Fraction: 2.5 Gy
Plan Total Fractions Prescribed: 28
Plan Total Prescribed Dose: 70 Gy
Reference Point Dosage Given to Date: 5 Gy
Reference Point Session Dosage Given: 2.5 Gy
Session Number: 2

## 2024-04-15 ENCOUNTER — Other Ambulatory Visit: Payer: Self-pay

## 2024-04-15 ENCOUNTER — Ambulatory Visit
Admission: RE | Admit: 2024-04-15 | Discharge: 2024-04-15 | Disposition: A | Discharge status: Home / Self Care | Source: Ambulatory Visit | Attending: Radiation Oncology

## 2024-04-15 DIAGNOSIS — C61 Malignant neoplasm of prostate: Secondary | ICD-10-CM | POA: Diagnosis not present

## 2024-04-15 LAB — RAD ONC ARIA SESSION SUMMARY
Course Elapsed Days: 2
Plan Fractions Treated to Date: 3
Plan Prescribed Dose Per Fraction: 2.5 Gy
Plan Total Fractions Prescribed: 28
Plan Total Prescribed Dose: 70 Gy
Reference Point Dosage Given to Date: 7.5 Gy
Reference Point Session Dosage Given: 2.5 Gy
Session Number: 3

## 2024-04-16 ENCOUNTER — Ambulatory Visit
Admission: RE | Admit: 2024-04-16 | Discharge: 2024-04-16 | Disposition: A | Discharge status: Home / Self Care | Source: Ambulatory Visit | Attending: Radiation Oncology | Admitting: Radiation Oncology

## 2024-04-16 ENCOUNTER — Other Ambulatory Visit: Payer: Self-pay

## 2024-04-16 DIAGNOSIS — C61 Malignant neoplasm of prostate: Secondary | ICD-10-CM | POA: Diagnosis present

## 2024-04-16 LAB — RAD ONC ARIA SESSION SUMMARY
Course Elapsed Days: 3
Plan Fractions Treated to Date: 4
Plan Prescribed Dose Per Fraction: 2.5 Gy
Plan Total Fractions Prescribed: 28
Plan Total Prescribed Dose: 70 Gy
Reference Point Dosage Given to Date: 10 Gy
Reference Point Session Dosage Given: 2.5 Gy
Session Number: 4

## 2024-04-19 ENCOUNTER — Ambulatory Visit

## 2024-04-20 ENCOUNTER — Other Ambulatory Visit: Payer: Self-pay

## 2024-04-20 ENCOUNTER — Ambulatory Visit
Admission: RE | Admit: 2024-04-20 | Discharge: 2024-04-20 | Disposition: A | Discharge status: Home / Self Care | Source: Ambulatory Visit | Attending: Radiation Oncology

## 2024-04-20 DIAGNOSIS — C61 Malignant neoplasm of prostate: Secondary | ICD-10-CM | POA: Diagnosis not present

## 2024-04-20 LAB — RAD ONC ARIA SESSION SUMMARY
Course Elapsed Days: 7
Plan Fractions Treated to Date: 5
Plan Prescribed Dose Per Fraction: 2.5 Gy
Plan Total Fractions Prescribed: 28
Plan Total Prescribed Dose: 70 Gy
Reference Point Dosage Given to Date: 12.5 Gy
Reference Point Session Dosage Given: 2.5 Gy
Session Number: 5

## 2024-04-21 ENCOUNTER — Ambulatory Visit
Admission: RE | Admit: 2024-04-21 | Discharge: 2024-04-21 | Disposition: A | Discharge status: Home / Self Care | Source: Ambulatory Visit | Attending: Radiation Oncology

## 2024-04-21 ENCOUNTER — Other Ambulatory Visit: Payer: Self-pay

## 2024-04-21 DIAGNOSIS — C61 Malignant neoplasm of prostate: Secondary | ICD-10-CM | POA: Diagnosis not present

## 2024-04-21 LAB — RAD ONC ARIA SESSION SUMMARY
Course Elapsed Days: 8
Plan Fractions Treated to Date: 6
Plan Prescribed Dose Per Fraction: 2.5 Gy
Plan Total Fractions Prescribed: 28
Plan Total Prescribed Dose: 70 Gy
Reference Point Dosage Given to Date: 15 Gy
Reference Point Session Dosage Given: 2.5 Gy
Session Number: 6

## 2024-04-22 ENCOUNTER — Other Ambulatory Visit: Payer: Self-pay

## 2024-04-22 ENCOUNTER — Ambulatory Visit
Admission: RE | Admit: 2024-04-22 | Discharge: 2024-04-22 | Disposition: A | Discharge status: Home / Self Care | Source: Ambulatory Visit | Attending: Radiation Oncology | Admitting: Radiation Oncology

## 2024-04-22 ENCOUNTER — Ambulatory Visit
Admission: RE | Admit: 2024-04-22 | Discharge: 2024-04-22 | Disposition: A | Discharge status: Home / Self Care | Source: Ambulatory Visit | Attending: Radiation Oncology

## 2024-04-22 DIAGNOSIS — C61 Malignant neoplasm of prostate: Secondary | ICD-10-CM | POA: Diagnosis not present

## 2024-04-22 LAB — RAD ONC ARIA SESSION SUMMARY
Course Elapsed Days: 9
Plan Fractions Treated to Date: 7
Plan Prescribed Dose Per Fraction: 2.5 Gy
Plan Total Fractions Prescribed: 28
Plan Total Prescribed Dose: 70 Gy
Reference Point Dosage Given to Date: 17.5 Gy
Reference Point Session Dosage Given: 2.5 Gy
Session Number: 7

## 2024-04-23 ENCOUNTER — Ambulatory Visit
Admission: RE | Admit: 2024-04-23 | Discharge: 2024-04-23 | Disposition: A | Discharge status: Home / Self Care | Source: Ambulatory Visit | Attending: Radiation Oncology | Admitting: Radiation Oncology

## 2024-04-23 ENCOUNTER — Other Ambulatory Visit: Payer: Self-pay

## 2024-04-23 DIAGNOSIS — C61 Malignant neoplasm of prostate: Secondary | ICD-10-CM | POA: Diagnosis not present

## 2024-04-23 LAB — RAD ONC ARIA SESSION SUMMARY
Course Elapsed Days: 10
Plan Fractions Treated to Date: 8
Plan Prescribed Dose Per Fraction: 2.5 Gy
Plan Total Fractions Prescribed: 28
Plan Total Prescribed Dose: 70 Gy
Reference Point Dosage Given to Date: 20 Gy
Reference Point Session Dosage Given: 2.5 Gy
Session Number: 8

## 2024-04-26 ENCOUNTER — Ambulatory Visit
Admission: RE | Admit: 2024-04-26 | Discharge: 2024-04-26 | Disposition: A | Discharge status: Home / Self Care | Source: Ambulatory Visit | Attending: Radiation Oncology

## 2024-04-26 ENCOUNTER — Other Ambulatory Visit: Payer: Self-pay

## 2024-04-26 DIAGNOSIS — C61 Malignant neoplasm of prostate: Secondary | ICD-10-CM | POA: Diagnosis not present

## 2024-04-26 LAB — RAD ONC ARIA SESSION SUMMARY
Course Elapsed Days: 13
Plan Fractions Treated to Date: 9
Plan Prescribed Dose Per Fraction: 2.5 Gy
Plan Total Fractions Prescribed: 28
Plan Total Prescribed Dose: 70 Gy
Reference Point Dosage Given to Date: 22.5 Gy
Reference Point Session Dosage Given: 2.5 Gy
Session Number: 9

## 2024-04-27 ENCOUNTER — Other Ambulatory Visit: Payer: Self-pay

## 2024-04-27 ENCOUNTER — Ambulatory Visit
Admission: RE | Admit: 2024-04-27 | Discharge: 2024-04-27 | Disposition: A | Discharge status: Home / Self Care | Source: Ambulatory Visit | Attending: Radiation Oncology

## 2024-04-27 DIAGNOSIS — C61 Malignant neoplasm of prostate: Secondary | ICD-10-CM | POA: Diagnosis not present

## 2024-04-27 LAB — RAD ONC ARIA SESSION SUMMARY
Course Elapsed Days: 14
Plan Fractions Treated to Date: 10
Plan Prescribed Dose Per Fraction: 2.5 Gy
Plan Total Fractions Prescribed: 28
Plan Total Prescribed Dose: 70 Gy
Reference Point Dosage Given to Date: 25 Gy
Reference Point Session Dosage Given: 2.5 Gy
Session Number: 10

## 2024-04-28 ENCOUNTER — Ambulatory Visit
Admission: RE | Admit: 2024-04-28 | Discharge: 2024-04-28 | Disposition: A | Discharge status: Home / Self Care | Source: Ambulatory Visit | Attending: Radiation Oncology | Admitting: Radiation Oncology

## 2024-04-28 ENCOUNTER — Other Ambulatory Visit: Payer: Self-pay

## 2024-04-28 DIAGNOSIS — C61 Malignant neoplasm of prostate: Secondary | ICD-10-CM | POA: Diagnosis not present

## 2024-04-28 LAB — RAD ONC ARIA SESSION SUMMARY
Course Elapsed Days: 15
Plan Fractions Treated to Date: 11
Plan Prescribed Dose Per Fraction: 2.5 Gy
Plan Total Fractions Prescribed: 28
Plan Total Prescribed Dose: 70 Gy
Reference Point Dosage Given to Date: 27.5 Gy
Reference Point Session Dosage Given: 2.5 Gy
Session Number: 11

## 2024-04-29 ENCOUNTER — Ambulatory Visit
Admission: RE | Admit: 2024-04-29 | Discharge: 2024-04-29 | Disposition: A | Discharge status: Home / Self Care | Source: Ambulatory Visit | Attending: Radiation Oncology | Admitting: Radiation Oncology

## 2024-04-29 ENCOUNTER — Other Ambulatory Visit: Payer: Self-pay

## 2024-04-29 ENCOUNTER — Other Ambulatory Visit: Payer: Self-pay | Admitting: Radiation Oncology

## 2024-04-29 DIAGNOSIS — C61 Malignant neoplasm of prostate: Secondary | ICD-10-CM | POA: Diagnosis not present

## 2024-04-29 LAB — RAD ONC ARIA SESSION SUMMARY
Course Elapsed Days: 16
Plan Fractions Treated to Date: 12
Plan Prescribed Dose Per Fraction: 2.5 Gy
Plan Total Fractions Prescribed: 28
Plan Total Prescribed Dose: 70 Gy
Reference Point Dosage Given to Date: 30 Gy
Reference Point Session Dosage Given: 2.5 Gy
Session Number: 12

## 2024-04-29 MED ORDER — TAMSULOSIN HCL 0.4 MG PO CAPS: 0.4000 mg | ORAL_CAPSULE | Freq: Every day | ORAL | 5 refills | Status: DC

## 2024-04-30 ENCOUNTER — Ambulatory Visit
Admission: RE | Admit: 2024-04-30 | Discharge: 2024-04-30 | Disposition: A | Discharge status: Home / Self Care | Source: Ambulatory Visit | Attending: Radiation Oncology | Admitting: Radiation Oncology

## 2024-04-30 ENCOUNTER — Ambulatory Visit

## 2024-04-30 ENCOUNTER — Other Ambulatory Visit: Payer: Self-pay

## 2024-04-30 DIAGNOSIS — C61 Malignant neoplasm of prostate: Secondary | ICD-10-CM | POA: Diagnosis not present

## 2024-04-30 LAB — RAD ONC ARIA SESSION SUMMARY
Course Elapsed Days: 17
Plan Fractions Treated to Date: 13
Plan Prescribed Dose Per Fraction: 2.5 Gy
Plan Total Fractions Prescribed: 28
Plan Total Prescribed Dose: 70 Gy
Reference Point Dosage Given to Date: 32.5 Gy
Reference Point Session Dosage Given: 2.5 Gy
Session Number: 13

## 2024-05-03 ENCOUNTER — Ambulatory Visit
Admission: RE | Admit: 2024-05-03 | Discharge: 2024-05-03 | Disposition: A | Discharge status: Home / Self Care | Source: Ambulatory Visit | Attending: Radiation Oncology

## 2024-05-03 ENCOUNTER — Other Ambulatory Visit: Payer: Self-pay

## 2024-05-03 DIAGNOSIS — C61 Malignant neoplasm of prostate: Secondary | ICD-10-CM | POA: Diagnosis not present

## 2024-05-03 LAB — RAD ONC ARIA SESSION SUMMARY
Course Elapsed Days: 20
Plan Fractions Treated to Date: 14
Plan Prescribed Dose Per Fraction: 2.5 Gy
Plan Total Fractions Prescribed: 28
Plan Total Prescribed Dose: 70 Gy
Reference Point Dosage Given to Date: 35 Gy
Reference Point Session Dosage Given: 2.5 Gy
Session Number: 14

## 2024-05-04 ENCOUNTER — Other Ambulatory Visit: Payer: Self-pay

## 2024-05-04 ENCOUNTER — Ambulatory Visit
Admission: RE | Admit: 2024-05-04 | Discharge: 2024-05-04 | Disposition: A | Discharge status: Home / Self Care | Source: Ambulatory Visit | Attending: Radiation Oncology

## 2024-05-04 DIAGNOSIS — C61 Malignant neoplasm of prostate: Secondary | ICD-10-CM | POA: Diagnosis not present

## 2024-05-04 LAB — RAD ONC ARIA SESSION SUMMARY
Course Elapsed Days: 21
Plan Fractions Treated to Date: 15
Plan Prescribed Dose Per Fraction: 2.5 Gy
Plan Total Fractions Prescribed: 28
Plan Total Prescribed Dose: 70 Gy
Reference Point Dosage Given to Date: 37.5 Gy
Reference Point Session Dosage Given: 2.5 Gy
Session Number: 15

## 2024-05-05 ENCOUNTER — Other Ambulatory Visit: Payer: Self-pay

## 2024-05-05 ENCOUNTER — Ambulatory Visit
Admission: RE | Admit: 2024-05-05 | Discharge: 2024-05-05 | Disposition: A | Discharge status: Home / Self Care | Source: Ambulatory Visit | Attending: Radiation Oncology | Admitting: Radiation Oncology

## 2024-05-05 DIAGNOSIS — C61 Malignant neoplasm of prostate: Secondary | ICD-10-CM | POA: Diagnosis not present

## 2024-05-05 LAB — RAD ONC ARIA SESSION SUMMARY
Course Elapsed Days: 22
Plan Fractions Treated to Date: 16
Plan Prescribed Dose Per Fraction: 2.5 Gy
Plan Total Fractions Prescribed: 28
Plan Total Prescribed Dose: 70 Gy
Reference Point Dosage Given to Date: 40 Gy
Reference Point Session Dosage Given: 2.5 Gy
Session Number: 16

## 2024-05-06 ENCOUNTER — Other Ambulatory Visit: Payer: Self-pay

## 2024-05-06 ENCOUNTER — Ambulatory Visit
Admission: RE | Admit: 2024-05-06 | Discharge: 2024-05-06 | Disposition: A | Discharge status: Home / Self Care | Source: Ambulatory Visit | Attending: Radiation Oncology | Admitting: Radiation Oncology

## 2024-05-06 DIAGNOSIS — C61 Malignant neoplasm of prostate: Secondary | ICD-10-CM | POA: Diagnosis not present

## 2024-05-06 LAB — RAD ONC ARIA SESSION SUMMARY
Course Elapsed Days: 23
Plan Fractions Treated to Date: 17
Plan Prescribed Dose Per Fraction: 2.5 Gy
Plan Total Fractions Prescribed: 28
Plan Total Prescribed Dose: 70 Gy
Reference Point Dosage Given to Date: 42.5 Gy
Reference Point Session Dosage Given: 2.5 Gy
Session Number: 17

## 2024-05-07 ENCOUNTER — Ambulatory Visit
Admission: RE | Admit: 2024-05-07 | Discharge: 2024-05-07 | Disposition: A | Discharge status: Home / Self Care | Source: Ambulatory Visit | Attending: Radiation Oncology | Admitting: Radiation Oncology

## 2024-05-07 ENCOUNTER — Other Ambulatory Visit: Payer: Self-pay

## 2024-05-07 DIAGNOSIS — C61 Malignant neoplasm of prostate: Secondary | ICD-10-CM | POA: Diagnosis not present

## 2024-05-07 LAB — RAD ONC ARIA SESSION SUMMARY
Course Elapsed Days: 24
Plan Fractions Treated to Date: 18
Plan Prescribed Dose Per Fraction: 2.5 Gy
Plan Total Fractions Prescribed: 28
Plan Total Prescribed Dose: 70 Gy
Reference Point Dosage Given to Date: 45 Gy
Reference Point Session Dosage Given: 2.5 Gy
Session Number: 18

## 2024-05-10 ENCOUNTER — Other Ambulatory Visit: Payer: Self-pay

## 2024-05-10 ENCOUNTER — Ambulatory Visit
Admission: RE | Admit: 2024-05-10 | Discharge: 2024-05-10 | Disposition: A | Discharge status: Home / Self Care | Source: Ambulatory Visit | Attending: Radiation Oncology | Admitting: Radiation Oncology

## 2024-05-10 DIAGNOSIS — C61 Malignant neoplasm of prostate: Secondary | ICD-10-CM | POA: Diagnosis not present

## 2024-05-10 LAB — RAD ONC ARIA SESSION SUMMARY
Course Elapsed Days: 27
Plan Fractions Treated to Date: 19
Plan Prescribed Dose Per Fraction: 2.5 Gy
Plan Total Fractions Prescribed: 28
Plan Total Prescribed Dose: 70 Gy
Reference Point Dosage Given to Date: 47.5 Gy
Reference Point Session Dosage Given: 2.5 Gy
Session Number: 19

## 2024-05-11 ENCOUNTER — Ambulatory Visit
Admission: RE | Admit: 2024-05-11 | Discharge: 2024-05-11 | Disposition: A | Discharge status: Home / Self Care | Source: Ambulatory Visit | Attending: Radiation Oncology | Admitting: Radiation Oncology

## 2024-05-11 ENCOUNTER — Other Ambulatory Visit: Payer: Self-pay

## 2024-05-11 DIAGNOSIS — C61 Malignant neoplasm of prostate: Secondary | ICD-10-CM | POA: Diagnosis not present

## 2024-05-11 LAB — RAD ONC ARIA SESSION SUMMARY
Course Elapsed Days: 28
Plan Fractions Treated to Date: 20
Plan Prescribed Dose Per Fraction: 2.5 Gy
Plan Total Fractions Prescribed: 28
Plan Total Prescribed Dose: 70 Gy
Reference Point Dosage Given to Date: 50 Gy
Reference Point Session Dosage Given: 2.5 Gy
Session Number: 20

## 2024-05-12 ENCOUNTER — Other Ambulatory Visit: Payer: Self-pay

## 2024-05-12 ENCOUNTER — Ambulatory Visit
Admission: RE | Admit: 2024-05-12 | Discharge: 2024-05-12 | Disposition: A | Discharge status: Home / Self Care | Source: Ambulatory Visit | Attending: Radiation Oncology | Admitting: Radiation Oncology

## 2024-05-12 DIAGNOSIS — C61 Malignant neoplasm of prostate: Secondary | ICD-10-CM | POA: Diagnosis not present

## 2024-05-12 LAB — RAD ONC ARIA SESSION SUMMARY
Course Elapsed Days: 29
Plan Fractions Treated to Date: 21
Plan Prescribed Dose Per Fraction: 2.5 Gy
Plan Total Fractions Prescribed: 28
Plan Total Prescribed Dose: 70 Gy
Reference Point Dosage Given to Date: 52.5 Gy
Reference Point Session Dosage Given: 2.5 Gy
Session Number: 21

## 2024-05-13 ENCOUNTER — Other Ambulatory Visit: Payer: Self-pay

## 2024-05-13 ENCOUNTER — Ambulatory Visit
Admission: RE | Admit: 2024-05-13 | Discharge: 2024-05-13 | Disposition: A | Discharge status: Home / Self Care | Source: Ambulatory Visit | Attending: Radiation Oncology | Admitting: Radiation Oncology

## 2024-05-13 DIAGNOSIS — C61 Malignant neoplasm of prostate: Secondary | ICD-10-CM | POA: Diagnosis not present

## 2024-05-13 LAB — RAD ONC ARIA SESSION SUMMARY
Course Elapsed Days: 30
Plan Fractions Treated to Date: 22
Plan Prescribed Dose Per Fraction: 2.5 Gy
Plan Total Fractions Prescribed: 28
Plan Total Prescribed Dose: 70 Gy
Reference Point Dosage Given to Date: 55 Gy
Reference Point Session Dosage Given: 2.5 Gy
Session Number: 22

## 2024-05-14 ENCOUNTER — Other Ambulatory Visit: Payer: Self-pay

## 2024-05-14 ENCOUNTER — Ambulatory Visit
Admission: RE | Admit: 2024-05-14 | Discharge: 2024-05-14 | Disposition: A | Discharge status: Home / Self Care | Source: Ambulatory Visit | Attending: Radiation Oncology | Admitting: Radiation Oncology

## 2024-05-14 DIAGNOSIS — C61 Malignant neoplasm of prostate: Secondary | ICD-10-CM | POA: Diagnosis not present

## 2024-05-14 LAB — RAD ONC ARIA SESSION SUMMARY
Course Elapsed Days: 31
Plan Fractions Treated to Date: 23
Plan Prescribed Dose Per Fraction: 2.5 Gy
Plan Total Fractions Prescribed: 28
Plan Total Prescribed Dose: 70 Gy
Reference Point Dosage Given to Date: 57.5 Gy
Reference Point Session Dosage Given: 2.5 Gy
Session Number: 23

## 2024-05-18 ENCOUNTER — Other Ambulatory Visit: Payer: Self-pay

## 2024-05-18 ENCOUNTER — Ambulatory Visit
Admission: RE | Admit: 2024-05-18 | Discharge: 2024-05-18 | Disposition: A | Discharge status: Home / Self Care | Source: Ambulatory Visit | Attending: Radiation Oncology | Admitting: Radiation Oncology

## 2024-05-18 DIAGNOSIS — C61 Malignant neoplasm of prostate: Secondary | ICD-10-CM | POA: Diagnosis present

## 2024-05-18 LAB — RAD ONC ARIA SESSION SUMMARY
Course Elapsed Days: 35
Plan Fractions Treated to Date: 24
Plan Prescribed Dose Per Fraction: 2.5 Gy
Plan Total Fractions Prescribed: 28
Plan Total Prescribed Dose: 70 Gy
Reference Point Dosage Given to Date: 60 Gy
Reference Point Session Dosage Given: 2.5 Gy
Session Number: 24

## 2024-05-19 ENCOUNTER — Other Ambulatory Visit: Payer: Self-pay

## 2024-05-19 ENCOUNTER — Ambulatory Visit
Admission: RE | Admit: 2024-05-19 | Discharge: 2024-05-19 | Discharge status: Home / Self Care | Source: Ambulatory Visit | Attending: Radiation Oncology

## 2024-05-19 ENCOUNTER — Ambulatory Visit
Admission: RE | Admit: 2024-05-19 | Discharge: 2024-05-19 | Disposition: A | Discharge status: Home / Self Care | Source: Ambulatory Visit | Attending: Radiation Oncology

## 2024-05-19 DIAGNOSIS — C61 Malignant neoplasm of prostate: Secondary | ICD-10-CM | POA: Diagnosis not present

## 2024-05-19 LAB — RAD ONC ARIA SESSION SUMMARY
Course Elapsed Days: 36
Course Elapsed Days: 36
Plan Fractions Treated to Date: 25
Plan Fractions Treated to Date: 26
Plan Prescribed Dose Per Fraction: 2.5 Gy
Plan Prescribed Dose Per Fraction: 2.5 Gy
Plan Total Fractions Prescribed: 28
Plan Total Fractions Prescribed: 28
Plan Total Prescribed Dose: 70 Gy
Plan Total Prescribed Dose: 70 Gy
Reference Point Dosage Given to Date: 62.5 Gy
Reference Point Dosage Given to Date: 65 Gy
Reference Point Session Dosage Given: 2.5 Gy
Reference Point Session Dosage Given: 2.5 Gy
Session Number: 25
Session Number: 26

## 2024-05-20 ENCOUNTER — Ambulatory Visit
Admission: RE | Admit: 2024-05-20 | Discharge: 2024-05-20 | Disposition: A | Discharge status: Home / Self Care | Source: Ambulatory Visit | Attending: Radiation Oncology | Admitting: Radiation Oncology

## 2024-05-20 ENCOUNTER — Other Ambulatory Visit: Payer: Self-pay

## 2024-05-20 DIAGNOSIS — C61 Malignant neoplasm of prostate: Secondary | ICD-10-CM | POA: Diagnosis not present

## 2024-05-20 LAB — RAD ONC ARIA SESSION SUMMARY
Course Elapsed Days: 37
Plan Fractions Treated to Date: 27
Plan Prescribed Dose Per Fraction: 2.5 Gy
Plan Total Fractions Prescribed: 28
Plan Total Prescribed Dose: 70 Gy
Reference Point Dosage Given to Date: 67.5 Gy
Reference Point Session Dosage Given: 2.5 Gy
Session Number: 27

## 2024-05-21 ENCOUNTER — Other Ambulatory Visit: Payer: Self-pay

## 2024-05-21 ENCOUNTER — Ambulatory Visit

## 2024-05-21 ENCOUNTER — Ambulatory Visit
Admission: RE | Admit: 2024-05-21 | Discharge: 2024-05-21 | Disposition: A | Discharge status: Home / Self Care | Source: Ambulatory Visit | Attending: Radiation Oncology | Admitting: Radiation Oncology

## 2024-05-21 DIAGNOSIS — C61 Malignant neoplasm of prostate: Secondary | ICD-10-CM | POA: Diagnosis not present

## 2024-05-21 LAB — RAD ONC ARIA SESSION SUMMARY
Course Elapsed Days: 38
Plan Fractions Treated to Date: 28
Plan Prescribed Dose Per Fraction: 2.5 Gy
Plan Total Fractions Prescribed: 28
Plan Total Prescribed Dose: 70 Gy
Reference Point Dosage Given to Date: 70 Gy
Reference Point Session Dosage Given: 2.5 Gy
Session Number: 28

## 2024-05-21 NOTE — Progress Notes (Signed)
 Patient was presented to the [facility] on 09/26/23 for his stage T1c adenocarcinoma of the prostate with a Gleason's score of 4+3 and a PSA of 5.96.  Patient proceed with treatment recommendations of 5.5 weeks of daily radiation and had his final radiation treatment on 05/21/24.   Patient is scheduled for a post treatment nurse call on 06/22/24 and has active ongoing follow up's with urology.  Next appointment 06/22/24.

## 2024-05-24 ENCOUNTER — Ambulatory Visit

## 2024-05-25 NOTE — Radiation Completion Notes (Addendum)
  Radiation Oncology         [phone] ________________________________  Name: Adam Hess MRN: [id number]  Date: 05/21/2024  DOB: [birth year]  Referring Physician: GLENDIA LAW, M.D. Date of Service: 2024-05-25 Radiation Oncologist: Adina Barge, M.D. [facility] -      RADIATION ONCOLOGY END OF TREATMENT NOTE     Diagnosis: 68 y.o. gentleman with stage T1c adenocarcinoma of the prostate with a Gleason's score of 4+3 and a PSA of 5.96   Intent: Curative     ==========DELIVERED PLANS==========  First Treatment Date: 2024-04-13 Last Treatment Date: 2024-05-21   Plan Name: Prostate Site: Prostate Technique: IMRT Mode: Photon Dose Per Fraction: 2.5 Gy Prescribed Dose (Delivered / Prescribed): 70 Gy / 70 Gy Prescribed Fxs (Delivered / Prescribed): 28 / 28     ==========ON TREATMENT VISIT DATES========== 2024-04-16, 2024-04-22, 2024-04-29, 2024-05-07, 2024-05-14, 2024-05-21   See weekly On Treatment Notes in Epic for details in the Media tab (listed as Progress notes on the On Treatment Visit Dates listed above).   The patient will receive a call in about one month from the radiation oncology department. He will continue follow up with his urologist, Dr. Renda, as well.  ------------------------------------------------   [name], MD [facility]  Radiation Oncology Direct Dial: [phone]  Fax: [phone] [email or web address]  Skype  LinkedIn

## 2024-06-07 ENCOUNTER — Telehealth: Payer: Self-pay

## 2024-06-07 NOTE — Telephone Encounter (Signed)
 Adam Hess completed radiation treatment on 05/21/2024 for prostate cancer his wife call left message to return call for possible side effects.  RN returned call spoke with Adam Hess who wanted to inform staff that he had been having frequency/urgency of stool.  He had been going so much that he felt a small lump in his abdomen (possibly straining) so he went to Urgent care for evaluation and was told had an umbilical herniation they have referred him to a specialist.  He wasn't sure if this was possibly related to radiation treatment.  RN asked if he had been lifting anything too heavy and he denied lifting anything heavy during time he was being treated or after treatment.  He reports this is being addressed and he just wanted to let the Dr. Patrcia and staff at [facility] know.

## 2024-06-14 ENCOUNTER — Ambulatory Visit: Payer: Self-pay | Admitting: Surgery

## 2024-06-14 NOTE — H&P (Signed)
 Chief Complaint: New Consultation ( umb hernia w/o obst or gang)       History of Present Illness: Adam Hess is a 68 y.o. male who is seen today as an office consultation at the request of Dr. Darra for evaluation of New Consultation ( umb hernia w/o obst or gang) .     This is a 68 year old male with no history of previous abdominal surgery who presents with a new diagnosis of an umbilical hernia.  The patient was diagnosed with prostate cancer last year and recently completed radiation therapy.  He had a CT PET in December 2024 that in retrospect revealed a small umbilical hernia.  The patient states that he had noticed some fullness in his umbilicus last year.  It has become slightly larger.  It remains reducible when supine.  He denies any obstructive symptoms.  He decrease his level of activity during radiation therapy but he normally works out at [employer]  Since the hernia has become larger, he has noticed some slight discomfort.     Review of Systems: A complete review of systems was obtained from the patient.  I have reviewed this information and discussed as appropriate with the patient.  See HPI as well for other ROS.   Review of Systems  Constitutional: Negative.   HENT: Negative.    Eyes: Negative.   Respiratory: Negative.    Cardiovascular: Negative.   Gastrointestinal:  Positive for constipation and nausea.  Genitourinary: Negative.   Musculoskeletal: Negative.   Skin: Negative.   Neurological: Negative.   Endo/Heme/Allergies:  Bruises/bleeds easily.  Psychiatric/Behavioral: Negative.          Medical History: Past Medical History      Past Medical History:  Diagnosis Date   Arthritis     COPD (chronic obstructive pulmonary disease) (CMS/HHS-HCC)     Family history of prostate cancer     Hyperlipidemia     Prostate cancer (CMS/HHS-HCC)          Problem List     Patient Active Problem List  Diagnosis   Aortic atherosclerosis ()   COPD mixed type  (CMS/HHS-HCC)   Hyperlipidemia   Family history of prostate cancer   Dysphonia   Lumbosacral radiculopathy   Prostate cancer (CMS/HHS-HCC)        Past Surgical History       Past Surgical History:  Procedure Laterality Date   Gold seed implant (N/A)   03/26/2024   Space oar instillation (N/A)   03/26/2024   COLONOSCOPY            Allergies       Allergies  Allergen Reactions   Itraconazole Anaphylaxis, Dermatitis, Hives, Itching, Other (See Comments), Rash, Shortness Of Breath, Swelling and Unknown      Resp. distress   Levofloxacin Anaphylaxis, Dermatitis, Hives, Rash, Shortness Of Breath and Unknown      REACTION: severe rash   Reports rash and breathing difficulty.   Reports rash and breathing difficulty.    REACTION: severe rash   Penicillins Hives, Other (See Comments), Rash, Shortness Of Breath and Unknown      REACTION: unknown   Reports rash and breathing difficulty   As child   As child    Reports rash and breathing difficulty    REACTION: unknown        Medications Ordered Prior to Encounter        Current Outpatient Medications on File Prior to Visit  Medication Sig Dispense Refill  tadalafiL (CIALIS) 5 MG tablet Take 5 mg by mouth once daily       TRELEGY ELLIPTA 100-62.5-25 mcg inhaler Inhale 1 Puff into the lungs once daily       tamsulosin  (FLOMAX ) 0.4 mg capsule Take 0.4 mg by mouth daily after dinner (Patient not taking: Reported on 06/14/2024)        No current facility-administered medications on file prior to visit.        Family History       Family History  Problem Relation Age of Onset   Allergies Mother     Cancer Mother     Heart disease Father     Prostate cancer Father     Leukemia Brother          Tobacco Use History  Social History        Tobacco Use  Smoking Status Former   Types: Cigarettes  Smokeless Tobacco Never        Social History  Social History         Socioeconomic History   Marital status:  Married  Tobacco Use   Smoking status: Former      Types: Cigarettes   Smokeless tobacco: Never  Substance and Sexual Activity   Alcohol use: Yes      Alcohol/week: 0.0 - 1.0 standard drinks of alcohol   Drug use: Never   Sexual activity: Defer    Social Drivers of Health        Financial Resource Strain: Not At Risk (10/22/2023)    Received from [facility]    Financial Resource Strain     I worry about the financial problems I will have in the future as a result of my illness or treatment.: Not at all     I am satisfied with my current financial situation.: Not at all  Food Insecurity: No Food Insecurity (10/22/2023)    Received from [facility]    Hunger Vital Sign     Within the past 12 months, you worried that your food would run out before you got the money to buy more.: Never true     Within the past 12 months, the food you bought just didn't last and you didn't have money to get more.: Never true  Transportation Needs: No Transportation Needs (10/22/2023)    Received from [facility]    PRAPARE - Transportation     Lack of Transportation (Medical): No     Lack of Transportation (Non-Medical): No    Received from [facility]    Social Network  Housing Stability: Unknown (06/14/2024)    Housing Stability Vital Sign     Homeless in the Last Year: No        Objective:         Vitals:    06/14/24 1013  BP: (!) 142/87  Pulse: 71  Temp: 36.7 C (98 F)  TempSrc: Temporal  SpO2: 92%  Weight: (!) 109.9 kg (242 lb 3.2 oz)  Height: 185.4 cm (6' 1)  PainSc: 0-No pain    Body mass index is 31.95 kg/m.   Physical Exam    Constitutional:  WDWN in NAD, conversant, no obvious deformities; lying in bed comfortably Eyes:  Pupils equal, round; sclera anicteric; moist conjunctiva; no lid lag HENT:  Oral mucosa moist; good dentition  Neck:  No masses palpated, trachea midline; no  thyromegaly Lungs:  CTA bilaterally; normal respiratory effort CV:  Regular rate and rhythm; no murmurs; extremities well-perfused with no edema Abd:  +bowel sounds, soft, non-tender, no palpable organomegaly; small reducible umbilical hernia.  The fascial defect is 1.5 cm in diameter.  No overlying skin changes. Musc: Normal gait; no apparent clubbing or cyanosis in extremities Lymphatic:  No palpable cervical or axillary lymphadenopathy Skin:  Warm, dry; no sign of jaundice Psychiatric - alert and oriented x 4; calm mood and affect     Assessment and Plan:  Diagnoses and all orders for this visit:   Umbilical hernia without obstruction or gangrene       I would like to give the patient a few weeks to fully recover from his radiation therapy which ended earlier this month.   Recommend umbilical hernia repair with mesh.The surgical procedure has been discussed with the patient.  Potential risks, benefits, alternative treatments, and expected outcomes have been explained.  All of the patient's questions at this time have been answered.  The likelihood of reaching the patient's treatment goal is good.  The patient understands the proposed surgical procedure and wishes to proceed.     Adam Hess DEWAYNE LIMA, MD  06/14/2024 10:53 AM

## 2024-06-22 ENCOUNTER — Ambulatory Visit
Admission: RE | Admit: 2024-06-22 | Discharge: 2024-06-22 | Disposition: A | Discharge status: Home / Self Care | Source: Ambulatory Visit | Attending: Radiation Oncology | Admitting: Radiation Oncology

## 2024-06-22 DIAGNOSIS — C61 Malignant neoplasm of prostate: Secondary | ICD-10-CM | POA: Insufficient documentation

## 2024-06-22 NOTE — Progress Notes (Signed)
  Radiation Oncology         [phone] ________________________________  Name: Adam Hess MRN: [id number]  Date of Service: 06/22/2024  DOB: [birth year]  Post Treatment Telephone Note  Diagnosis:  C61 Malignant neoplasm of prostate Staging on 2023-08-01: Prostate cancer (HCC) T=cT1c, N=cN0, M=cM0 Intent: Curative First Treatment Date: 2024-04-13 Last Treatment Date: 2024-05-21   Plan Name: Prostate Site: Prostate Technique: IMRT Mode: Photon Dose Per Fraction: 2.5 Gy Prescribed Dose (Delivered / Prescribed): 70 Gy / 70 Gy Prescribed Fxs (Delivered / Prescribed): 28 / 28  Pre Treatment IPSS Score: 9   The patient was available for call today.   Symptoms of fatigue have improved since completing therapy.  Symptoms of bladder changes have improved since completing therapy. Current symptoms include some frequency, and medications for bladder symptoms include Flomax .  Symptoms of bowel changes have improved since completing therapy.   Post Treatment IPSS Score: 5 IPSS Questionnaire (AUA-7): Over the past month.   1)  How often have you had a sensation of not emptying your bladder completely after you finish urinating?  1 - Less than 1 time in 5  2)  How often have you had to urinate again less than two hours after you finished urinating? 0 - Not at all  3)  How often have you found you stopped and started again several times when you urinated?  1- Less than 1 time in 5  4) How difficult have you found it to postpone urination?  0 - Not at all  5) How often have you had a weak urinary stream?  0 - Not at all  6) How often have you had to push or strain to begin urination?  0 - Not at all  7) How many times did you most typically get up to urinate from the time you went to bed until the time you got up in the morning?  3 - 3 times  Total score:  5. Which indicates mild symptoms  0-7 mildly symptomatic   8-19 moderately symptomatic   20-35 severely symptomatic   Patient  has a scheduled follow up visit with his urologist, Dr. Renda, MD on 06/24/2024 for ongoing surveillance. He was counseled that PSA levels will be drawn in the urology office, and was reassured that additional time is expected to improve bowel and bladder symptoms. He was encouraged to call back with concerns or questions regarding radiation.

## 2024-07-09 ENCOUNTER — Other Ambulatory Visit: Payer: Self-pay | Admitting: Urology

## 2024-07-09 DIAGNOSIS — C61 Malignant neoplasm of prostate: Secondary | ICD-10-CM

## 2024-08-24 ENCOUNTER — Ambulatory Visit: Attending: Internal Medicine | Admitting: Internal Medicine

## 2024-08-24 ENCOUNTER — Encounter: Payer: Self-pay | Admitting: Internal Medicine

## 2024-08-24 VITALS — BP 150/94 | HR 67 | Ht 73.0 in | Wt 240.0 lb

## 2024-08-24 DIAGNOSIS — E785 Hyperlipidemia, unspecified: Secondary | ICD-10-CM

## 2024-08-24 DIAGNOSIS — I700 Atherosclerosis of aorta: Secondary | ICD-10-CM

## 2024-08-24 DIAGNOSIS — C61 Malignant neoplasm of prostate: Secondary | ICD-10-CM

## 2024-08-24 DIAGNOSIS — I10 Essential (primary) hypertension: Secondary | ICD-10-CM

## 2024-08-24 DIAGNOSIS — J449 Chronic obstructive pulmonary disease, unspecified: Secondary | ICD-10-CM

## 2024-08-24 NOTE — Progress Notes (Signed)
 Cardiology Office Note:  .    Date:  08/24/2024  ID:  Adam Hess, DOB Nov 07, 1955, MRN [id number] PCP: Adam Hess  [facility] Providers Cardiologist:  None     CC: Near Syncope Consulted for the evaluation of Near syncope at the behest of Adam Hess   History of Present Illness: .    Adam Hess is a 68 y.o. male with COPD and aortic atherosclerosis who presents with dizziness. He experienced dizziness after taking erythromycin, which was prescribed following dental work. The dizziness was accompanied by vertigo and sweating, prompting a visit to urgent care where erythromycin was discontinued. Since stopping the medication, he has not experienced further dizziness. He has a history of prostate cancer treated with extensive radiation in July and August. He was previously on Totarol for prostate issues and has since switched to Flomax . His current medications include Catechol and Rebif.   He has a history of smoking, having quit in 2009, but occasionally smokes cigars. He maintains an active lifestyle, going to the gym with his wife three times a week, although he has been less active recently due to hernia surgery and recovery. He has 11 grandchildren, seven of whom live nearby, and he stays busy with family activities. No chest pain, shortness of breath, palpitations, or syncope. He reports swelling in his feet during travel, which he manages with compression socks. He has a family history of heart issues; his father had congestive heart failure and a defibrillator, and his mother had heart issues related to obesity.   Discussed the use of AI scribe software for clinical note transcription with the patient, who gave verbal consent to proceed.   Relevant histories: .  Social  - originally from the [place] ROS: As per HPI.   Physical Exam:    VS:  BP (!) 150/94 (BP Location: Left Arm)   Pulse 67   Ht 6' 1 (1.854 m)   Wt 240 lb (108.9 kg)   SpO2 97%   BMI 31.66  kg/m    Wt Readings from Last 3 Encounters:  08/24/24 240 lb (108.9 kg)  03/26/24 237 lb (107.5 kg)  12/18/23 244 lb 9.6 oz (110.9 kg)    Gen:  Abdominal pain noted, and in mild distress Cardiac: No Rubs or Gallops, no Murmur, RRR +2 radial pulses Respiratory: Clear to auscultation bilaterally, normal effort, normal  respiratory rate GI: Soft, nontender, non-distended  MS: No  edema;  moves all extremities Integument: Skin feels warm Neuro:  At time of evaluation, alert and oriented to person/place/time/situation  Psych: Normal affect, patient feels ok   ASSESSMENT AND PLAN: .     An EKG was ordered for near syncope and shows SR  Dizziness secondary to medication interaction and polypharmacy  - Dizziness likely due to interaction between erythromycin, Flomax , and other medications, causing symptomatic hypotension. Symptoms resolved after discontinuation of erythromycin and Flomax .   Aortic atherosclerosis HLD - Incidental finding on imaging. No current symptoms of chest pain or shortness of breath. Previous cardiac evaluation showed no significant findings. Discussed potential for coronary artery calcium score to assess plaque burden and guide prevention strategies. - Offered coronary artery calcium score to assess plaque burden.  - Discussed potential for more aggressive blood pressure management if significant plaque is found.   Hypertension  Blood pressure elevated during visit, likely due to pain. Current management not aggressive due to pain and recent weight gain. Discussed potential need for medication if blood pressure remains elevated after pain  resolution.  - Monitor blood pressure at home.  - Will consider antihypertensive medication if blood pressure remains elevated after pain resolution.  - discussed lifestyle interventions  PRN f/u (interested in prevention planning, proceeds with CAC score, or new cardiac complaints)  Adam Leavens, MD FASE  [facility] Cardiologist [facility]  [address] [place], [address] [place], [place] [address] [phone]  11:54 AM

## 2024-08-24 NOTE — Patient Instructions (Signed)
 Medication Instructions:  Your physician recommends that you continue on your current medications as directed. Please refer to the Current Medication list given to you today.  *If you need a refill on your cardiac medications before your next appointment, please call your pharmacy*  Lab Work: NONE  If you have labs (blood work) drawn today and your tests are completely normal, you will receive your results only by: MyChart Message (if you have MyChart) OR A paper copy in the mail If you have any lab test that is abnormal or we need to change your treatment, we will call you to review the results.  Testing/Procedures: NONE  Follow-Up:As needed At [facility], you and your health needs are our priority.  As part of our continuing mission to provide you with exceptional heart care, our providers are all part of one team.  This team includes your primary Cardiologist (physician) and Advanced Practice Providers or APPs (Physician Assistants and Nurse Practitioners) who all work together to provide you with the care you need, when you need it.   Provider:   Gloriann Larger, MD

## 2024-09-20 ENCOUNTER — Other Ambulatory Visit: Payer: Self-pay | Admitting: Surgery

## 2024-09-20 ENCOUNTER — Encounter: Payer: Self-pay | Admitting: Surgery

## 2024-09-20 DIAGNOSIS — M5416 Radiculopathy, lumbar region: Secondary | ICD-10-CM

## 2024-09-20 NOTE — Progress Notes (Signed)
" ° °  PROVIDER:  DONNICE DEWAYNE LIMA, MD  MRN: [id number] DOB: 1956-03-07 DATE OF ENCOUNTER: 09/20/2024 Interval History:   This is a 69 year old male with no history of previous abdominal surgery who presents with a new diagnosis of an umbilical hernia. The patient was diagnosed with prostate cancer last year and recently completed radiation therapy. He had a CT PET in December 2024 that in retrospect revealed a small umbilical hernia. The patient states that he had noticed some fullness in his umbilicus last year. It has become slightly larger. It remains reducible when supine. He denies any obstructive symptoms. He decrease his level of activity during radiation therapy but he normally works out at [employer]. Since the hernia has become larger, he has noticed some slight discomfort.   On 08/18/2024, he underwent umbilical hernia repair with mesh.  We used a small Ventralex mesh for a 1.5 cm defect.  The patient reports no abdominal pain related to his incision.  However, he has had severe exacerbation of his lumbar disc disease.  He is having severe pain and is having difficulty sleeping.  The pain is radiating down his right posterior leg.  He has had 2 courses of prednisone from his PCP.  He is also on tramadol and Flexeril with minimal relief.  He is scheduled to see neurosurgery later this week.  His last lumbar spine MRI was 2 years ago.  Physical Examination:   Physical Exam   His umbilical incision is healing well with no sign of infection.  No sign of recurrent hernia   Assessment and Plan:   Adam Hess is a 69 y.o. male who underwent umbilical hernia repair with mesh on 08/18/2024.  Diagnoses and all orders for this visit:  Umbilical hernia without obstruction or gangrene     To expedite his lumbar spine workup, I have ordered the MRI of the lumbar spine to evaluate his lumbar disc disease.  Hopefully this will speed up the process for the patient. Return if symptoms worsen or fail  to improve.   The plan was discussed in detail with the patient today, who expressed understanding.  The patient has my contact information, and understands to call me with any additional questions or concerns in the interval.  I would be happy to see the patient back sooner if the need arises.   MATTHEW KAI TSUEI, MD  "

## 2024-09-22 ENCOUNTER — Ambulatory Visit
Admission: RE | Admit: 2024-09-22 | Discharge: 2024-09-22 | Disposition: A | Discharge status: Home / Self Care | Source: Ambulatory Visit | Attending: Surgery | Admitting: Surgery

## 2024-09-22 DIAGNOSIS — M5416 Radiculopathy, lumbar region: Secondary | ICD-10-CM

## 2024-09-28 ENCOUNTER — Encounter: Payer: Self-pay | Admitting: *Deleted

## 2024-10-08 ENCOUNTER — Other Ambulatory Visit: Payer: Self-pay | Admitting: Radiation Oncology

## 2024-11-18 ENCOUNTER — Inpatient Hospital Stay: Admitting: *Deleted
